# Patient Record
Sex: Female | Born: 1970 | Race: Black or African American | Hispanic: No | Marital: Married | Smoking: Never smoker
Health system: Southern US, Community
[De-identification: ages and names within clinical notes are randomized; demographics above are authoritative.]

## PROBLEM LIST (undated history)

## (undated) DIAGNOSIS — R7303 Prediabetes: Secondary | ICD-10-CM

## (undated) DIAGNOSIS — L500 Allergic urticaria: Secondary | ICD-10-CM

## (undated) HISTORY — DX: Allergic urticaria: L50.0

## (undated) HISTORY — PX: CHOLECYSTECTOMY: SHX55

## (undated) HISTORY — DX: Prediabetes: R73.03

---

## 2005-04-17 ENCOUNTER — Emergency Department: Payer: Self-pay | Admitting: Emergency Medicine

## 2005-09-12 ENCOUNTER — Ambulatory Visit: Payer: Self-pay | Admitting: Obstetrics and Gynecology

## 2005-10-06 ENCOUNTER — Observation Stay: Payer: Self-pay | Admitting: Obstetrics and Gynecology

## 2005-11-21 ENCOUNTER — Inpatient Hospital Stay: Payer: Self-pay | Admitting: Obstetrics and Gynecology

## 2006-12-23 ENCOUNTER — Other Ambulatory Visit: Payer: Self-pay

## 2006-12-23 ENCOUNTER — Emergency Department: Payer: Self-pay

## 2007-09-26 ENCOUNTER — Other Ambulatory Visit: Payer: Self-pay

## 2007-09-26 ENCOUNTER — Emergency Department: Payer: Self-pay | Admitting: Emergency Medicine

## 2007-09-30 ENCOUNTER — Ambulatory Visit: Payer: Self-pay | Admitting: Surgery

## 2007-10-07 ENCOUNTER — Ambulatory Visit: Payer: Self-pay | Admitting: Surgery

## 2010-08-16 ENCOUNTER — Ambulatory Visit: Payer: Self-pay | Admitting: Obstetrics and Gynecology

## 2015-01-18 ENCOUNTER — Other Ambulatory Visit: Payer: Self-pay | Admitting: Obstetrics and Gynecology

## 2015-01-18 DIAGNOSIS — Z1231 Encounter for screening mammogram for malignant neoplasm of breast: Secondary | ICD-10-CM

## 2015-02-07 ENCOUNTER — Ambulatory Visit
Admission: RE | Admit: 2015-02-07 | Discharge: 2015-02-07 | Disposition: A | Discharge status: Home / Self Care | Payer: No Typology Code available for payment source | Source: Ambulatory Visit | Attending: Obstetrics and Gynecology | Admitting: Obstetrics and Gynecology

## 2015-02-07 DIAGNOSIS — Z1231 Encounter for screening mammogram for malignant neoplasm of breast: Secondary | ICD-10-CM | POA: Diagnosis present

## 2015-02-07 DIAGNOSIS — R928 Other abnormal and inconclusive findings on diagnostic imaging of breast: Secondary | ICD-10-CM | POA: Diagnosis not present

## 2015-02-09 ENCOUNTER — Other Ambulatory Visit: Payer: Self-pay | Admitting: Obstetrics and Gynecology

## 2015-02-09 DIAGNOSIS — R928 Other abnormal and inconclusive findings on diagnostic imaging of breast: Secondary | ICD-10-CM

## 2015-02-09 DIAGNOSIS — N6489 Other specified disorders of breast: Secondary | ICD-10-CM

## 2015-02-19 ENCOUNTER — Ambulatory Visit
Admission: RE | Admit: 2015-02-19 | Discharge: 2015-02-19 | Disposition: A | Discharge status: Home / Self Care | Payer: No Typology Code available for payment source | Source: Ambulatory Visit | Attending: Obstetrics and Gynecology | Admitting: Obstetrics and Gynecology

## 2015-02-19 DIAGNOSIS — R928 Other abnormal and inconclusive findings on diagnostic imaging of breast: Secondary | ICD-10-CM | POA: Insufficient documentation

## 2015-02-19 DIAGNOSIS — N63 Unspecified lump in breast: Secondary | ICD-10-CM | POA: Diagnosis not present

## 2015-02-19 DIAGNOSIS — N6489 Other specified disorders of breast: Secondary | ICD-10-CM

## 2016-05-19 DIAGNOSIS — E669 Obesity, unspecified: Secondary | ICD-10-CM | POA: Insufficient documentation

## 2016-05-19 DIAGNOSIS — Z6835 Body mass index (BMI) 35.0-35.9, adult: Secondary | ICD-10-CM | POA: Insufficient documentation

## 2016-05-21 ENCOUNTER — Emergency Department
Admission: EM | Admit: 2016-05-21 | Discharge: 2016-05-21 | Disposition: A | Discharge status: Home / Self Care | Payer: 59 | Attending: Emergency Medicine | Admitting: Emergency Medicine

## 2016-05-21 ENCOUNTER — Emergency Department: Payer: 59

## 2016-05-21 ENCOUNTER — Encounter: Payer: Self-pay | Admitting: Emergency Medicine

## 2016-05-21 DIAGNOSIS — K219 Gastro-esophageal reflux disease without esophagitis: Secondary | ICD-10-CM | POA: Diagnosis not present

## 2016-05-21 DIAGNOSIS — R0789 Other chest pain: Secondary | ICD-10-CM | POA: Diagnosis present

## 2016-05-21 DIAGNOSIS — R079 Chest pain, unspecified: Secondary | ICD-10-CM

## 2016-05-21 LAB — BASIC METABOLIC PANEL
Anion gap: 7 (ref 5–15)
BUN: 9 mg/dL (ref 6–20)
CHLORIDE: 106 mmol/L (ref 101–111)
CO2: 25 mmol/L (ref 22–32)
CREATININE: 0.72 mg/dL (ref 0.44–1.00)
Calcium: 8.7 mg/dL — ABNORMAL LOW (ref 8.9–10.3)
GFR calc Af Amer: >60 mL/min (ref >60)
GFR calc non Af Amer: >60 mL/min (ref >60)
GLUCOSE: 101 mg/dL — AB (ref 65–99)
POTASSIUM: 3.5 mmol/L (ref 3.5–5.1)
Sodium: 138 mmol/L (ref 135–145)

## 2016-05-21 LAB — CBC
HEMATOCRIT: 37.4 % (ref 35.0–47.0)
Hemoglobin: 12.2 g/dL (ref 12.0–16.0)
MCH: 26.3 pg (ref 26.0–34.0)
MCHC: 32.7 g/dL (ref 32.0–36.0)
MCV: 80.5 fL (ref 80.0–100.0)
PLATELETS: 309 10*3/uL (ref 150–440)
RBC: 4.65 MIL/uL (ref 3.80–5.20)
RDW: 14.1 % (ref 11.5–14.5)
WBC: 8 10*3/uL (ref 3.6–11.0)

## 2016-05-21 LAB — TROPONIN I: Troponin I: <0.03 ng/mL (ref <0.03)

## 2016-05-21 MED ORDER — GI COCKTAIL ~~LOC~~
30.0000 mL | Freq: Once | ORAL | Status: AC
  Administered 2016-05-21: 30 mL via ORAL
  Filled 2016-05-21: qty 30

## 2016-05-21 MED ORDER — RANITIDINE HCL 150 MG PO TABS: 150.0000 mg | ORAL_TABLET | Freq: Two times a day (BID) | ORAL | 0 refills | Status: DC

## 2016-05-21 NOTE — ED Provider Notes (Signed)
[facility] Emergency Department Provider Note  ____________________________________________   First MD Initiated Contact with Patient 05/21/16 1953     (approximate)  I have reviewed the triage vital signs and the nursing notes.   HISTORY  Chief Complaint Chest Pain   HPI Rose Mcmillan is a 45 y.o. female with a history of C-section and a family history of GERD who is presenting to the emergency department today with a feeling of pressure in her central chest which she says started after eating pizza and cake yesterday at lunch. Says the pressure is worsened when she swallows she has been able to eat rice today. She denies any nausea or vomiting. Said that she took Pepto-Bismol last night which temporarily relieved the pain.Denies any associated symptoms of shortness of breath, nausea vomiting or diarrhea. Has a family history of reflux. Her mother's in the room and says that she has similar symptoms with her reflux. The patient says that she has also had a mild amount of radiation of the pain through to her back. Denies any recent injury. However, she says that she was opening boxes Monday night and feels that she may have pulled a muscle in her chest. However, she says that there is no pain when she moves her upper extremities. Denies any hormone supplementation or birth control. Says that the pain is mildly increased with deep breathing.   History reviewed. No pertinent past medical history.  There are no active problems to display for this patient.   Past Surgical History:  Procedure Laterality Date  . CESAREAN SECTION     x2  . CHOLECYSTECTOMY      Prior to Admission medications   Not on File    Allergies Review of patient's allergies indicates no known allergies.  No family history on file.  Social History Social History  Substance Use Topics  . Smoking status: Never Smoker  . Smokeless tobacco: Never Used  . Alcohol use  No    Review of Systems Constitutional: No fever/chills Eyes: No visual changes. ENT: No sore throat. Cardiovascular: As above Respiratory: Denies shortness of breath. Gastrointestinal: No abdominal pain.  No nausea, no vomiting.  No diarrhea.  No constipation. Genitourinary: Negative for dysuria. Musculoskeletal: As above Skin: Negative for rash. Neurological: Negative for headaches, focal weakness or numbness.  10-point ROS otherwise negative.  ____________________________________________   PHYSICAL EXAM:  VITAL SIGNS: ED Triage Vitals  Enc Vitals Group     BP 05/21/16 1844 121/76     Pulse Rate 05/21/16 1844 97     Resp 05/21/16 1844 18     Temp 05/21/16 1844 98.6 F (37 C)     Temp Source 05/21/16 1844 Oral     SpO2 05/21/16 1844 100 %     Weight 05/21/16 1844 189 lb (85.7 kg)     Height 05/21/16 1844 5\' 4"  (1.626 m)     Head Circumference --      Peak Flow --      Pain Score 05/21/16 1846 0     Pain Loc --      Pain Edu? --      Excl. in GC? --     Constitutional: Alert and oriented. Well appearing and in no acute distress. Eyes: Conjunctivae are normal. PERRL. EOMI. Head: Atraumatic. Nose: No congestion/rhinnorhea. Mouth/Throat: Mucous membranes are moist.  Neck: No stridor.   Cardiovascular: Normal rate, regular rhythm. Grossly normal heart sounds.  Good peripheral circulation With equal and palpable bilateral  dorsalis pedis as well as radial pulses. Chest pain is not reproduced for palpation. Respiratory: Normal respiratory effort.  No retractions. Lungs CTAB. Gastrointestinal: Soft and nontender. No distention.  Musculoskeletal: No lower extremity tenderness nor edema.  No joint effusions. Neurologic:  Normal speech and language. No gross focal neurologic deficits are appreciated.  Skin:  Skin is warm, dry and intact. No rash noted. Psychiatric: Mood and affect are normal. Speech and behavior are normal.  ____________________________________________     LABS (all labs ordered are listed, but only abnormal results are displayed)  Labs Reviewed  BASIC METABOLIC PANEL - Abnormal; Notable for the following:       Result Value   Glucose, Bld 101 (*)    Calcium 8.7 (*)    All other components within normal limits  CBC  TROPONIN I   ____________________________________________  EKG  ED ECG REPORT I, Arelia Longest, the attending physician, personally viewed and interpreted this ECG.   Date: 05/21/2016  EKG Time: 1843  Rate: 89  Rhythm: normal sinus rhythm  Axis: Normal  Intervals:none  ST&T Change: No ST segment elevation or depression. No abnormal T-wave inversion.  ____________________________________________  RADIOLOGY  DG Chest 2 View (Accession [id number]) (Order [id number])  Imaging  Date: 05/21/2016 Department: [facility] EMERGENCY DEPARTMENT Released By: Lorriane Shire, RN (auto-released) Authorizing: Myrna Blazer, MD  PACS Images   Show images for DG Chest 2 View  Study Result   CLINICAL DATA:  Chest pain. Central chest tightness since yesterday.  EXAM: CHEST  2 VIEW  COMPARISON:  12/23/2006  FINDINGS: The cardiomediastinal contours are normal. The lungs are clear. Pulmonary vasculature is normal. No consolidation, pleural effusion, or pneumothorax. No acute osseous abnormalities are seen.  IMPRESSION: No acute pulmonary process.   Electronically Signed   By: Rubye Oaks M.D.   On: 05/21/2016 19:09    ____________________________________________   PROCEDURES  Procedure(s) performed:   Procedures  Critical Care performed:   ____________________________________________   INITIAL IMPRESSION / ASSESSMENT AND PLAN / ED COURSE  Pertinent labs & imaging results that were available during my care of the patient were reviewed by me and considered in my medical decision making (see chart for  details).  ----------------------------------------- 9:13 PM on 05/21/2016 -----------------------------------------  Patient says that after GI cocktail that her pain went down to a 7 out of 10. Given a warm coke and she says now that her pain is at a 1-2 out of 10. Unclear if there was a small amount of impaction in the esophagus versus irritation from reflux. We'll be starting the patient on Zantac. She is borderline asymptomatic at this time. Very reassuring workup in the patient has been having symptoms for over 24 hours. Will be discharged home. Reviewed the plan with the patient as well as her family and they're understanding and wanted to comply.  Clinical Course     ____________________________________________   FINAL CLINICAL IMPRESSION(S) / ED DIAGNOSES  Chest pain. Reflux.    NEW MEDICATIONS STARTED DURING THIS VISIT:  New Prescriptions   No medications on file     Note:  This document was prepared using Dragon voice recognition software and may include unintentional dictation errors.    Myrna Blazer, MD 05/21/16 2114

## 2016-05-21 NOTE — ED Notes (Signed)
Discharge instructions reviewed with patient. Patient verbalized understanding. Patient ambulated to lobby without difficulty.   

## 2016-05-21 NOTE — ED Triage Notes (Signed)
Pt reports chest tightness started yesterday, reports when she's swallowing, she feels like something is getting stuck mid chest. Pt reports it started after eating pizza yesterday. Pt denies shortness of breath, nausea or vomiting.

## 2016-05-21 NOTE — ED Notes (Signed)
Pt states chest tightness and difficulty swallowing since eating pizza and cake yesterday for lunch. Pt states she took tums and pepto. States pain less today.

## 2016-05-23 ENCOUNTER — Other Ambulatory Visit: Payer: Self-pay | Admitting: Adult Health

## 2016-05-23 DIAGNOSIS — N6310 Unspecified lump in the right breast, unspecified quadrant: Secondary | ICD-10-CM

## 2016-06-09 ENCOUNTER — Ambulatory Visit
Admission: RE | Admit: 2016-06-09 | Discharge: 2016-06-09 | Disposition: A | Discharge status: Home / Self Care | Payer: 59 | Source: Ambulatory Visit | Attending: Adult Health | Admitting: Adult Health

## 2016-06-09 DIAGNOSIS — N6310 Unspecified lump in the right breast, unspecified quadrant: Secondary | ICD-10-CM | POA: Diagnosis present

## 2016-09-05 IMAGING — MG MM DIGITAL SCREENING BILATERAL
4 series · 4 of 4 positions shown · non-contrast
Comparison: Previous exam(s).

CLINICAL DATA: Screening.

EXAM:
DIGITAL SCREENING BILATERAL MAMMOGRAM WITH CAD

[R CC]
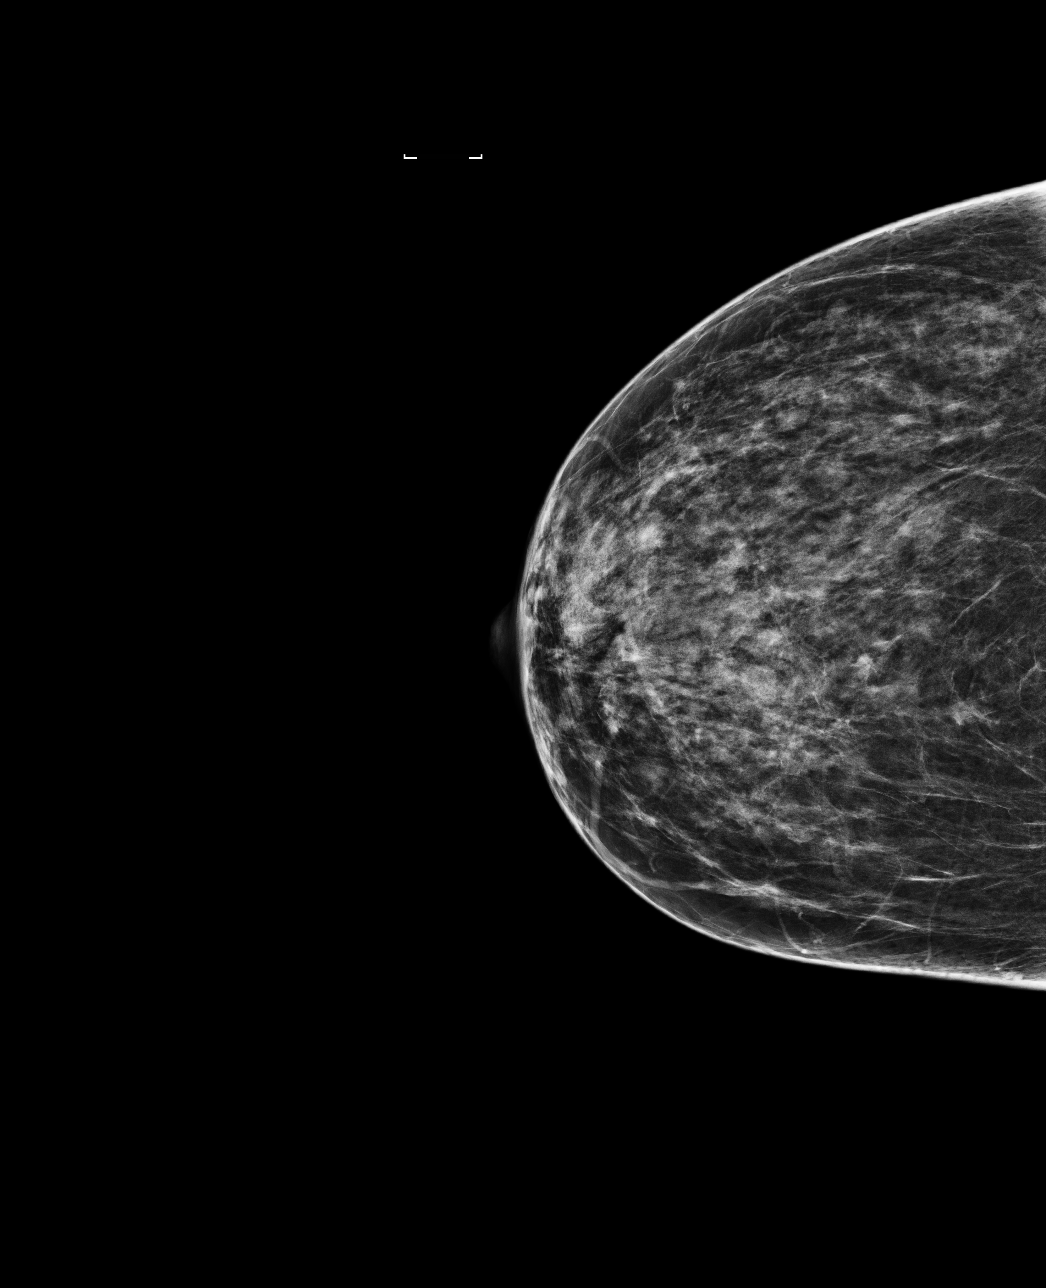

[L CC]
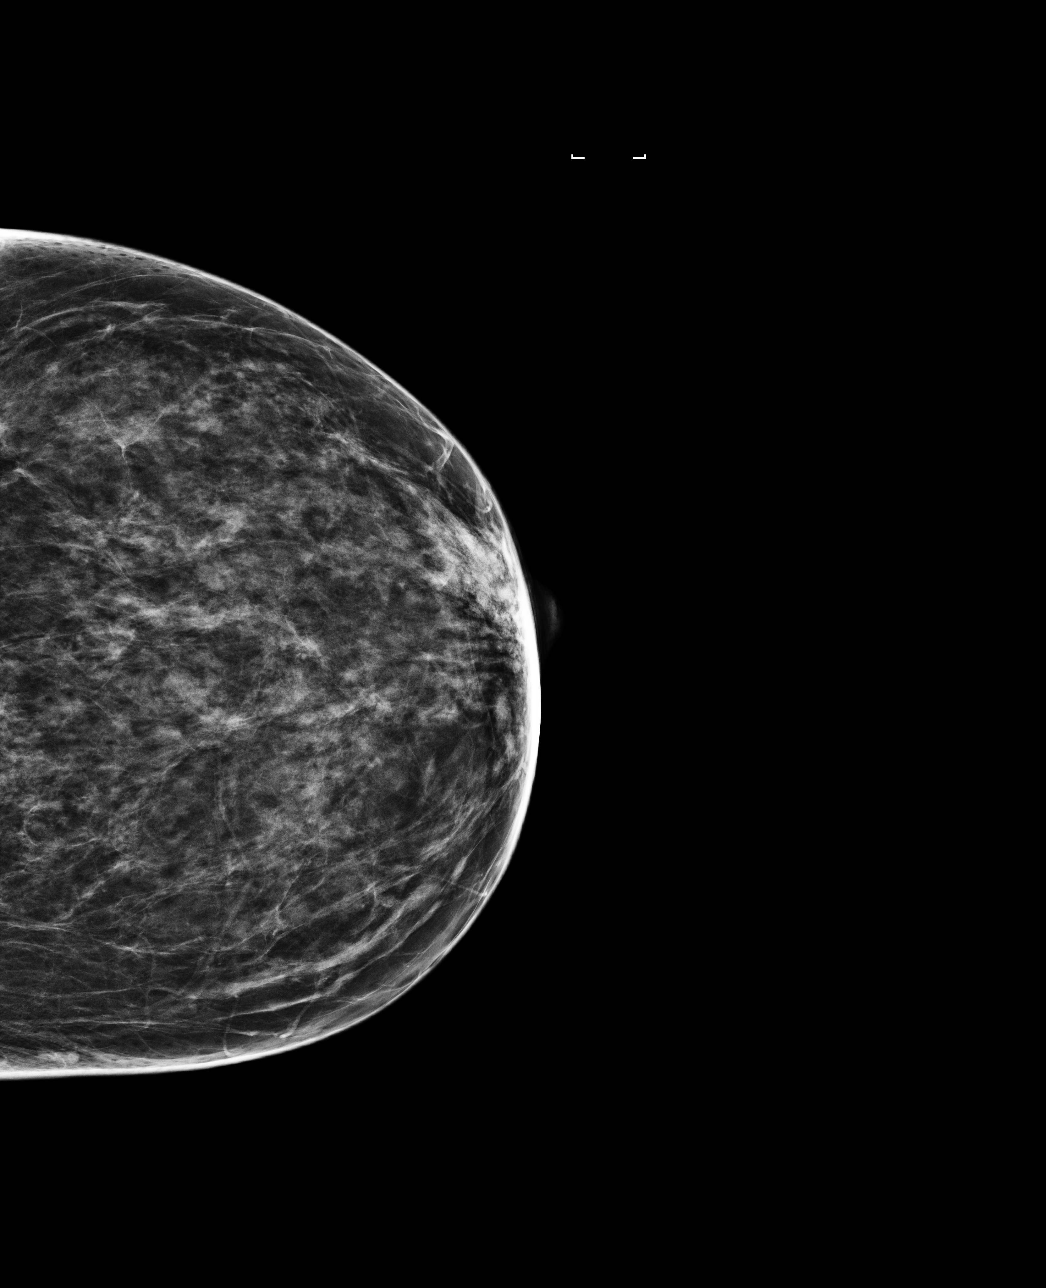

[L MLO]
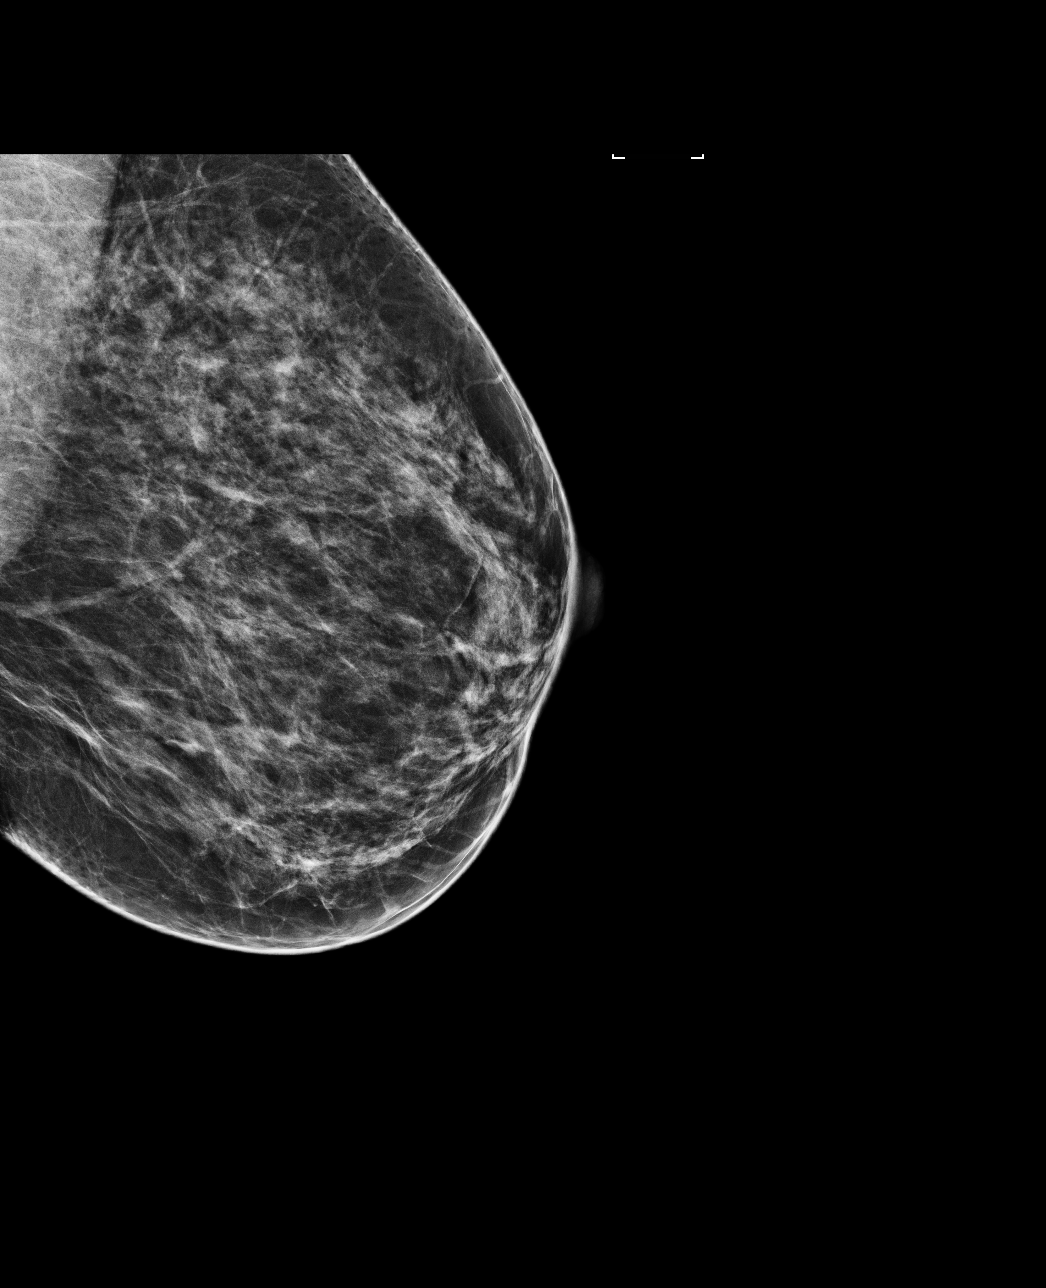

[R MLO]
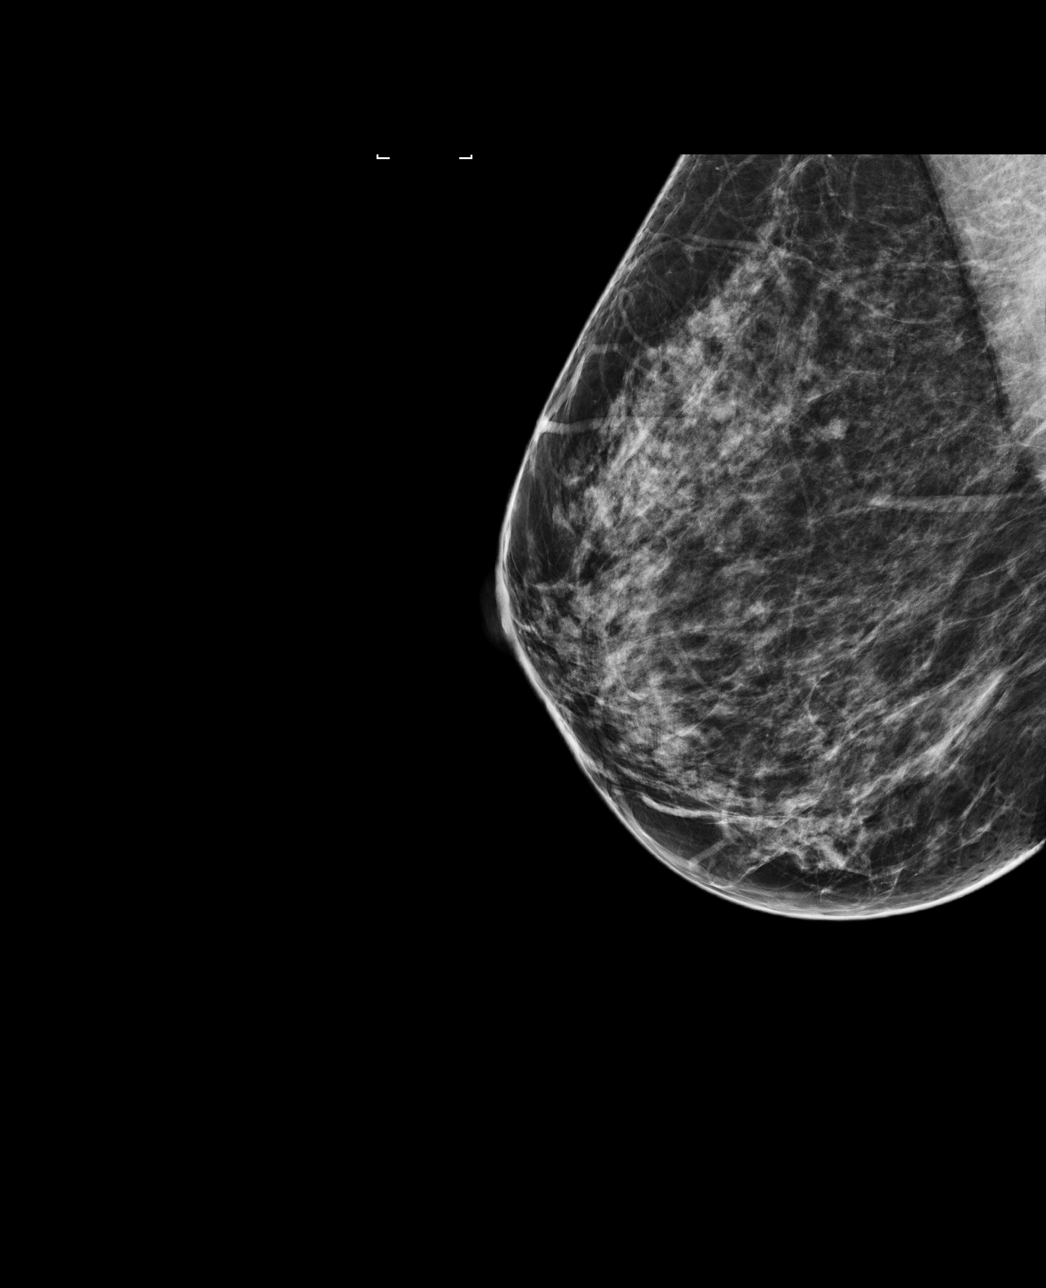

[4 of 4 positions shown; findings below may reference images not displayed]

ACR Breast Density Category c: The breast tissue is heterogeneously
dense, which may obscure small masses.
FINDINGS: In the right breast, a possible asymmetry warrants further
evaluation. In the left breast, no findings suspicious for
malignancy. Images were processed with CAD.
IMPRESSION: Further evaluation is suggested for possible asymmetry in the right
breast.

RECOMMENDATION:
Diagnostic mammogram and possibly ultrasound of the right breast.
(Code:S9-I-77H)

The patient will be contacted regarding the findings, and additional
imaging will be scheduled.

BI-RADS CATEGORY  0: Incomplete. Need additional imaging evaluation
and/or prior mammograms for comparison.

## 2016-12-21 ENCOUNTER — Emergency Department: Payer: 59

## 2016-12-21 ENCOUNTER — Emergency Department
Admission: EM | Admit: 2016-12-21 | Discharge: 2016-12-21 | Disposition: A | Discharge status: Home / Self Care | Payer: 59 | Attending: Emergency Medicine | Admitting: Emergency Medicine

## 2016-12-21 ENCOUNTER — Encounter: Payer: Self-pay | Admitting: Emergency Medicine

## 2016-12-21 DIAGNOSIS — M546 Pain in thoracic spine: Secondary | ICD-10-CM | POA: Insufficient documentation

## 2016-12-21 DIAGNOSIS — R0602 Shortness of breath: Secondary | ICD-10-CM | POA: Insufficient documentation

## 2016-12-21 DIAGNOSIS — Z79899 Other long term (current) drug therapy: Secondary | ICD-10-CM | POA: Insufficient documentation

## 2016-12-21 DIAGNOSIS — R071 Chest pain on breathing: Secondary | ICD-10-CM | POA: Insufficient documentation

## 2016-12-21 DIAGNOSIS — R079 Chest pain, unspecified: Secondary | ICD-10-CM

## 2016-12-21 LAB — CBC
HCT: 39 % (ref 35.0–47.0)
Hemoglobin: 12.9 g/dL (ref 12.0–16.0)
MCH: 26.3 pg (ref 26.0–34.0)
MCHC: 33 g/dL (ref 32.0–36.0)
MCV: 79.8 fL — AB (ref 80.0–100.0)
PLATELETS: 366 10*3/uL (ref 150–440)
RBC: 4.89 MIL/uL (ref 3.80–5.20)
RDW: 14 % (ref 11.5–14.5)
WBC: 7.8 10*3/uL (ref 3.6–11.0)

## 2016-12-21 LAB — BASIC METABOLIC PANEL
Anion gap: 8 (ref 5–15)
BUN: 8 mg/dL (ref 6–20)
CHLORIDE: 102 mmol/L (ref 101–111)
CO2: 28 mmol/L (ref 22–32)
CREATININE: 0.9 mg/dL (ref 0.44–1.00)
Calcium: 8.9 mg/dL (ref 8.9–10.3)
GFR calc Af Amer: >60 mL/min (ref >60)
GFR calc non Af Amer: >60 mL/min (ref >60)
GLUCOSE: 144 mg/dL — AB (ref 65–99)
Potassium: 3.3 mmol/L — ABNORMAL LOW (ref 3.5–5.1)
Sodium: 138 mmol/L (ref 135–145)

## 2016-12-21 LAB — TROPONIN I

## 2016-12-21 MED ORDER — MORPHINE SULFATE (PF) 4 MG/ML IV SOLN
4.0000 mg | Freq: Once | INTRAVENOUS | Status: AC
Start: 2016-12-21 — End: 2016-12-21
  Administered 2016-12-21: 4 mg via INTRAVENOUS
  Filled 2016-12-21: qty 1

## 2016-12-21 MED ORDER — ONDANSETRON HCL 4 MG/2ML IJ SOLN
4.0000 mg | Freq: Once | INTRAMUSCULAR | Status: AC
  Administered 2016-12-21: 4 mg via INTRAVENOUS
  Filled 2016-12-21: qty 2

## 2016-12-21 MED ORDER — SODIUM CHLORIDE 0.9 % IV BOLUS (SEPSIS)
1000.0000 mL | Freq: Once | INTRAVENOUS | Status: AC
  Administered 2016-12-21: 1000 mL via INTRAVENOUS

## 2016-12-21 MED ORDER — TRAMADOL HCL 50 MG PO TABS: 50.0000 mg | ORAL_TABLET | Freq: Four times a day (QID) | ORAL | 0 refills | Status: DC | PRN

## 2016-12-21 MED ORDER — IOPAMIDOL (ISOVUE-370) INJECTION 76%
75.0000 mL | Freq: Once | INTRAVENOUS | Status: AC | PRN
  Administered 2016-12-21: 75 mL via INTRAVENOUS

## 2016-12-21 MED ORDER — TRAMADOL HCL 50 MG PO TABS
50.0000 mg | ORAL_TABLET | Freq: Once | ORAL | Status: AC
  Administered 2016-12-21: 50 mg via ORAL

## 2016-12-21 MED ORDER — TRAMADOL HCL 50 MG PO TABS
ORAL_TABLET | ORAL | Status: AC
  Administered 2016-12-21: 50 mg via ORAL
  Filled 2016-12-21: qty 1

## 2016-12-21 NOTE — ED Provider Notes (Signed)
[facility] Emergency Department Provider Note  Time seen: 9:37 PM  I have reviewed the triage vital signs and the nursing notes.   HISTORY  Chief Complaint Chest Pain    HPI Rose Mcmillan is a 46 y.o. female with no past medical history who presents the emergency department with left-sided chest pain. According to the patient beginning yesterday she has been experiencing left posterior chest pain worse with movement or deep inspiration. She also states mild shortness of breath since this time as well. Denies any inciting event. Does not recall pulling any muscles, etc. Patient denies any history of blood clots in the past. Denies any leg pain or swelling. Denies any long plane or car rides. Denies estrogen use. Denies fever cough or congestion. She does state the pain is much worse with movement.  History reviewed. No pertinent past medical history.  There are no active problems to display for this patient.   Past Surgical History:  Procedure Laterality Date  . CESAREAN SECTION     x2  . CHOLECYSTECTOMY      Prior to Admission medications   Medication Sig Start Date End Date Taking? Authorizing Provider  ranitidine (ZANTAC) 150 MG tablet Take 1 tablet (150 mg total) by mouth 2 (two) times daily. 05/21/16 05/21/17  Myrna Blazer, MD    No Known Allergies  History reviewed. No pertinent family history.  Social History Social History  Substance Use Topics  . Smoking status: Never Smoker  . Smokeless tobacco: Never Used  . Alcohol use No    Review of Systems Constitutional: Negative for fever. Cardiovascular: Posterior left chest pain Respiratory: Positive for shortness of breath Gastrointestinal: Negative for abdominal pain Musculoskeletal: Left upper back pain Neurological: Negative for headache 10-point ROS otherwise negative.  ____________________________________________   PHYSICAL EXAM:  VITAL SIGNS: ED Triage Vitals   Enc Vitals Group     BP 12/21/16 2051 140/80     Pulse Rate 12/21/16 2051 (!) 115     Resp 12/21/16 2051 20     Temp 12/21/16 2051 98.9 F (37.2 C)     Temp Source 12/21/16 2051 Oral     SpO2 12/21/16 2051 98 %     Weight 12/21/16 2052 175 lb (79.4 kg)     Height 12/21/16 2052  (1.626 m)     Head Circumference --      Peak Flow --      Pain Score 12/21/16 2049 10     Pain Loc --      Pain Edu? --      Excl. in GC? --     Constitutional: Alert and oriented. Well appearing and in no distress. Eyes: Normal exam ENT   Head: Normocephalic and atraumatic.   Mouth/Throat: Mucous membranes are moist. Cardiovascular: Normal rate, regular rhythm. No murmur Respiratory: Mild tachypnea. Clear breath sounds bilaterally. No wheezes rales or rhonchi. Chest and back are nontender to palpation. Pain is reproduced with movement such as twisting or sitting up. Gastrointestinal: Soft and nontender. No distention. Musculoskeletal: Nontender with normal range of motion in all extremities. No lower extremity tenderness or edema. Neurologic:  Normal speech and language. No gross focal neurologic deficits are appreciated. Skin:  Skin is warm, dry and intact.  Psychiatric: Mood and affect are normal. Speech and behavior are normal.   ____________________________________________    EKG  EKG reviewed and interpreted by myself shows sinus tachycardia 108 bpm, narrow QRS, normal axis, normal intervals, nonspecific ST changes  without ST elevation.  ____________________________________________    RADIOLOGY  CT scan of the chest is negative  ____________________________________________   INITIAL IMPRESSION / ASSESSMENT AND PLAN / ED COURSE  Pertinent labs & imaging results that were available during my care of the patient were reviewed by me and considered in my medical decision making (see chart for details).  Patient presents the emergency department for left posterior chest pain  since yesterday worse with movement but also with any deep breathing. Patient is mildly tachycardic from 100-110bpm.  Given the patient's shortness of breath with pleuritic chest pain and tachycardia we'll obtain a CT angiography of the chest to rule out PE. Patient is labs including troponin are negative. We will treat with morphine, Zofran, IV fluids and continue to closely monitor while awaiting CT results.  CT angiography of the chest is negative. Labs are normal including negative troponin. EKG is reassuring. Highly suspect mostly skeletal type pain given the significant pain with movement and positional pain. We'll discharge with Ultram. Patient will follow-up with her primary care doctor.  ____________________________________________   FINAL CLINICAL IMPRESSION(S) / ED DIAGNOSES  Chest pain    Minna Antis, MD 12/21/16 2313

## 2016-12-21 NOTE — ED Notes (Signed)
Pt discharged to home.  Family member driving.  Discharge instructions reviewed.  Verbalized understanding.  No questions or concerns at this time.  Teach back verified.  Pt in NAD.  No items left in ED.   

## 2016-12-21 NOTE — Discharge Instructions (Signed)
You have been seen in the emergency department today for chest pain. Your workup has shown normal results. As we discussed please follow-up with your primary care physician in the next several days for recheck. Return to the emergency department for any further chest pain, trouble breathing, or any other symptom personally concerning to yourself.

## 2016-12-21 NOTE — ED Triage Notes (Signed)
Pt reports chest pain that started last night with back pain 10/10 tightness. Denies N/V/lightheadedness or sweats.  Reports radiating to back and SOB.

## 2016-12-21 NOTE — ED Notes (Signed)
Pt back from CT; visiting with family at bedside; rates chest pain 10/10

## 2018-06-24 ENCOUNTER — Ambulatory Visit: Payer: Self-pay | Admitting: Obstetrics and Gynecology

## 2018-06-30 ENCOUNTER — Encounter: Payer: Self-pay | Admitting: Obstetrics and Gynecology

## 2018-06-30 ENCOUNTER — Ambulatory Visit (INDEPENDENT_AMBULATORY_CARE_PROVIDER_SITE_OTHER): Payer: 59 | Admitting: Obstetrics and Gynecology

## 2018-06-30 ENCOUNTER — Other Ambulatory Visit (HOSPITAL_COMMUNITY)
Admission: RE | Admit: 2018-06-30 | Discharge: 2018-06-30 | Disposition: A | Discharge status: Home / Self Care | Payer: 59 | Source: Ambulatory Visit | Attending: Obstetrics and Gynecology | Admitting: Obstetrics and Gynecology

## 2018-06-30 VITALS — BP 130/76 | HR 85 | Ht 64.0 in | Wt 193.0 lb

## 2018-06-30 DIAGNOSIS — Z124 Encounter for screening for malignant neoplasm of cervix: Secondary | ICD-10-CM

## 2018-06-30 DIAGNOSIS — Z0000 Encounter for general adult medical examination without abnormal findings: Secondary | ICD-10-CM

## 2018-06-30 DIAGNOSIS — Z131 Encounter for screening for diabetes mellitus: Secondary | ICD-10-CM

## 2018-06-30 DIAGNOSIS — Z01411 Encounter for gynecological examination (general) (routine) with abnormal findings: Secondary | ICD-10-CM | POA: Diagnosis not present

## 2018-06-30 DIAGNOSIS — Z1329 Encounter for screening for other suspected endocrine disorder: Secondary | ICD-10-CM

## 2018-06-30 DIAGNOSIS — Z01419 Encounter for gynecological examination (general) (routine) without abnormal findings: Secondary | ICD-10-CM

## 2018-06-30 DIAGNOSIS — Z13220 Encounter for screening for lipoid disorders: Secondary | ICD-10-CM

## 2018-06-30 DIAGNOSIS — Z1151 Encounter for screening for human papillomavirus (HPV): Secondary | ICD-10-CM

## 2018-06-30 DIAGNOSIS — Z1239 Encounter for other screening for malignant neoplasm of breast: Secondary | ICD-10-CM

## 2018-06-30 DIAGNOSIS — N946 Dysmenorrhea, unspecified: Secondary | ICD-10-CM

## 2018-06-30 DIAGNOSIS — R635 Abnormal weight gain: Secondary | ICD-10-CM

## 2018-06-30 NOTE — Progress Notes (Signed)
PCP:  Raynelle Bring   Chief Complaint  Patient presents with  . Gynecologic Exam    concerned abt weight gain, lower back pain first 2 days of each cycle x 1 yr     HPI:      [name] is a 47 y.o. [id number] who LMP was Patient's last menstrual period was 06/18/2018 (exact date)., presents today for her NP annual examination.  Her menses are regular every 28-30 days, lasting 5-7 days.  Dysmenorrhea mild in low back for the past yr or so, occurring first 1-2 days of flow. She does not have intermenstrual bleeding. No vasomotor sx.  Sex activity: single partner, contraception - tubal ligation.  Last Pap: not recent; no hx of abn Hx of STDs: none  Last mammogram: not recent There is no FH of breast cancer. There is no FH of ovarian cancer. The patient does do self-breast exams.  Tobacco use: The patient denies current or previous tobacco use. Alcohol use: none No drug use.  Exercise: not active  She does get adequate calcium but not Vitamin D in her diet. No recent labs. Complains of about 15-20# wt gain in past 1 1/2 yrs. No diet/exercise changes.   History reviewed. No pertinent past medical history.  Past Surgical History:  Procedure Laterality Date  . CESAREAN SECTION     x2  . CHOLECYSTECTOMY      Family History  Problem Relation Age of Onset  . Colon cancer Maternal Grandmother 84  . Breast cancer Neg Hx   . Ovarian cancer Neg Hx     Social History   Socioeconomic History  . Marital status: Married    Spouse name: Not on file  . Number of children: Not on file  . Years of education: Not on file  . Highest education level: Not on file  Occupational History  . Not on file  Social Needs  . Financial resource strain: Not on file  . Food insecurity:    Worry: Not on file    Inability: Not on file  . Transportation needs:    Medical: Not on file    Non-medical: Not on file  Tobacco Use  . Smoking status: Never Smoker  . Smokeless  tobacco: Never Used  Substance and Sexual Activity  . Alcohol use: No  . Drug use: No  . Sexual activity: Yes    Birth control/protection: Surgical    Comment: tubal ligation  Lifestyle  . Physical activity:    Days per week: Not on file    Minutes per session: Not on file  . Stress: Not on file  Relationships  . Social connections:    Talks on phone: Not on file    Gets together: Not on file    Attends religious service: Not on file    Active member of club or organization: Not on file    Attends meetings of clubs or organizations: Not on file    Relationship status: Not on file  . Intimate partner violence:    Fear of current or ex partner: Not on file    Emotionally abused: Not on file    Physically abused: Not on file    Forced sexual activity: Not on file  Other Topics Concern  . Not on file  Social History Narrative  . Not on file    Outpatient Medications Prior to Visit  Medication Sig Dispense Refill  . ranitidine (ZANTAC) 150 MG tablet Take 1 tablet (150 mg  total) by mouth 2 (two) times daily. 60 tablet 0  . traMADol (ULTRAM) 50 MG tablet Take 1 tablet (50 mg total) by mouth every 6 (six) hours as needed. 10 tablet 0   No facility-administered medications prior to visit.     ROS:  Review of Systems  Constitutional: Negative for fatigue, fever and unexpected weight change.  Respiratory: Negative for cough, shortness of breath and wheezing.   Cardiovascular: Negative for chest pain, palpitations and leg swelling.  Gastrointestinal: Negative for blood in stool, constipation, diarrhea, nausea and vomiting.  Endocrine: Negative for cold intolerance, heat intolerance and polyuria.  Genitourinary: Negative for dyspareunia, dysuria, flank pain, frequency, genital sores, hematuria, menstrual problem, pelvic pain, urgency, vaginal bleeding, vaginal discharge and vaginal pain.  Musculoskeletal: Positive for back pain. Negative for joint swelling and myalgias.  Skin:  Negative for rash.  Neurological: Negative for dizziness, syncope, light-headedness, numbness and headaches.  Hematological: Negative for adenopathy.  Psychiatric/Behavioral: Negative for agitation, confusion, sleep disturbance and suicidal ideas. The patient is not nervous/anxious.    BREAST: No symptoms   Objective: BP 130/76   Pulse 85   Ht 5\' 4"  (1.626 m)   Wt 193 lb (87.5 kg)   LMP 06/18/2018 (Exact Date)   BMI 33.13 kg/m    Physical Exam  Constitutional: She is oriented to person, place, and time. She appears well-developed and well-nourished.  Genitourinary: Vagina normal and uterus normal. There is no rash or tenderness on the right labia. There is no rash or tenderness on the left labia. No erythema or tenderness in the vagina. No vaginal discharge found. Right adnexum does not display mass and does not display tenderness. Left adnexum does not display mass and does not display tenderness. Cervix does not exhibit motion tenderness or polyp. Uterus is not enlarged or tender.  Neck: Normal range of motion. No thyromegaly present.  Cardiovascular: Normal rate, regular rhythm and normal heart sounds.  No murmur heard. Pulmonary/Chest: Effort normal and breath sounds normal. Right breast exhibits no mass, no nipple discharge, no skin change and no tenderness. Left breast exhibits no mass, no nipple discharge, no skin change and no tenderness.  Abdominal: Soft. There is no tenderness. There is no guarding.  Musculoskeletal: Normal range of motion.  Neurological: She is alert and oriented to person, place, and time. No cranial nerve deficit.  Psychiatric: She has a normal mood and affect. Her behavior is normal.  Vitals reviewed.   Assessment/Plan: Encounter for annual routine gynecological examination  Cervical cancer screening - Plan: Cytology - PAP  Screening for HPV (human papillomavirus) - Plan: Cytology - PAP  Screening for breast cancer - Pt to sched mammo - Plan: MM  3D SCREEN BREAST BILATERAL  Blood tests for routine general physical examination - Plan: Comprehensive metabolic panel, Lipid panel, TSH + free T4, Hemoglobin A1c  Screening cholesterol level - Plan: Lipid panel  Screening for diabetes mellitus - Plan: Hemoglobin A1c  Thyroid disorder screening - Plan: TSH + free T4  Weight gain - Diet/exercise/wt loss changes discussed. CHeck labs. Will f/u wiht results.  - Plan: TSH + free T4, Hemoglobin A1c  Dysmenorrhea - Noticed in low back. Stretch/exercises/heating pad/NSAIDs. Will probably improve with increased activity.           GYN counsel breast self exam, mammography screening, adequate intake of calcium and vitamin D, diet and exercise     F/U  Return in about 1 year (around 07/01/2019).  Alicia B. Copland, PA-C 06/30/2018 3:04 PM

## 2018-06-30 NOTE — Patient Instructions (Signed)
I value your feedback and entrusting us with your care. If you get a [facility] patient survey, I would appreciate you taking the time to let us know about your experience today. Thank you! 

## 2018-07-05 LAB — CYTOLOGY - PAP
Diagnosis: NEGATIVE
HPV (WINDOPATH): NOT DETECTED

## 2018-07-15 ENCOUNTER — Telehealth: Payer: Self-pay

## 2018-07-15 NOTE — Telephone Encounter (Signed)
It's already in there. She can call to sched.

## 2018-07-15 NOTE — Telephone Encounter (Signed)
Called pt, no answer, LVMTRC. 

## 2018-07-15 NOTE — Telephone Encounter (Signed)
Pt needs orders put in so she can get her mammo

## 2018-07-23 NOTE — Telephone Encounter (Signed)
Pt aware order is in and she can call to schedule mammogram.

## 2018-07-26 ENCOUNTER — Other Ambulatory Visit: Payer: Self-pay | Admitting: Obstetrics and Gynecology

## 2018-07-26 DIAGNOSIS — N6310 Unspecified lump in the right breast, unspecified quadrant: Secondary | ICD-10-CM

## 2018-07-28 ENCOUNTER — Telehealth: Payer: Self-pay

## 2018-07-28 NOTE — Telephone Encounter (Signed)
Pt calling stating that she needs a referral for mammogram. She was supposed to have a dx mammo and did not go to it. So they are requiring a referral for this one. [facility]

## 2018-07-28 NOTE — Telephone Encounter (Signed)
[facility] Monday, [facility]/16/18 @ 3:00pm. Patient can contact [facility] directly at [phone] if she needs to reschedule. Lmtrc.

## 2018-07-28 NOTE — Telephone Encounter (Signed)
Orders already placed. Do you need to sched? Pls f/u with pt. Thx

## 2018-08-03 ENCOUNTER — Telehealth: Payer: Self-pay

## 2018-08-03 NOTE — Telephone Encounter (Signed)
Pt hasn't had blood work drawn.  Does she need new referral?  Hopes to get done tomorrow or Friday if lab open.  [phone]  Pt aware orders are still good.  Can have drawn here in the office tomorrow; [facility] closed on Fri.  Needs to be on sched.  Pt sched for 9am.

## 2018-08-04 ENCOUNTER — Other Ambulatory Visit: Payer: 59

## 2018-08-04 DIAGNOSIS — Z13220 Encounter for screening for lipoid disorders: Secondary | ICD-10-CM

## 2018-08-04 DIAGNOSIS — Z0000 Encounter for general adult medical examination without abnormal findings: Secondary | ICD-10-CM

## 2018-08-04 DIAGNOSIS — Z131 Encounter for screening for diabetes mellitus: Secondary | ICD-10-CM

## 2018-08-04 DIAGNOSIS — Z1329 Encounter for screening for other suspected endocrine disorder: Secondary | ICD-10-CM

## 2018-08-04 DIAGNOSIS — R635 Abnormal weight gain: Secondary | ICD-10-CM

## 2018-08-05 LAB — LIPID PANEL
CHOL/HDL RATIO: 2.1 ratio (ref 0.0–4.4)
Cholesterol, Total: 143 mg/dL (ref 100–199)
HDL: 67 mg/dL (ref >39)
LDL Calculated: 60 mg/dL (ref 0–99)
Triglycerides: 80 mg/dL (ref 0–149)
VLDL CHOLESTEROL CAL: 16 mg/dL (ref 5–40)

## 2018-08-05 LAB — COMPREHENSIVE METABOLIC PANEL
A/G RATIO: 1.4 (ref 1.2–2.2)
ALBUMIN: 4.1 g/dL (ref 3.5–5.5)
ALT: 12 IU/L (ref 0–32)
AST: 13 IU/L (ref 0–40)
Alkaline Phosphatase: 85 IU/L (ref 39–117)
BILIRUBIN TOTAL: 0.6 mg/dL (ref 0.0–1.2)
BUN / CREAT RATIO: 9 (ref 9–23)
BUN: 8 mg/dL (ref 6–24)
CALCIUM: 8.6 mg/dL — AB (ref 8.7–10.2)
CHLORIDE: 102 mmol/L (ref 96–106)
CO2: 23 mmol/L (ref 20–29)
Creatinine, Ser: 0.88 mg/dL (ref 0.57–1.00)
GFR calc Af Amer: 90 mL/min/{1.73_m2} (ref >59)
GFR, EST NON AFRICAN AMERICAN: 78 mL/min/{1.73_m2} (ref >59)
Globulin, Total: 2.9 g/dL (ref 1.5–4.5)
Glucose: 104 mg/dL — ABNORMAL HIGH (ref 65–99)
POTASSIUM: 4.1 mmol/L (ref 3.5–5.2)
Sodium: 142 mmol/L (ref 134–144)
Total Protein: 7 g/dL (ref 6.0–8.5)

## 2018-08-05 LAB — TSH+FREE T4
FREE T4: 1.2 ng/dL (ref 0.82–1.77)
TSH: 1.17 u[IU]/mL (ref 0.450–4.500)

## 2018-08-05 LAB — HEMOGLOBIN A1C
Est. average glucose Bld gHb Est-mCnc: 131 mg/dL
Hgb A1c MFr Bld: 6.2 % — ABNORMAL HIGH (ref 4.8–5.6)

## 2018-08-16 ENCOUNTER — Ambulatory Visit
Admission: RE | Admit: 2018-08-16 | Discharge: 2018-08-16 | Disposition: A | Discharge status: Home / Self Care | Payer: BLUE CROSS/BLUE SHIELD | Source: Ambulatory Visit | Attending: Obstetrics and Gynecology | Admitting: Obstetrics and Gynecology

## 2018-08-16 ENCOUNTER — Encounter: Payer: Self-pay | Admitting: Obstetrics and Gynecology

## 2018-08-16 DIAGNOSIS — N6310 Unspecified lump in the right breast, unspecified quadrant: Secondary | ICD-10-CM | POA: Insufficient documentation

## 2018-08-16 DIAGNOSIS — R922 Inconclusive mammogram: Secondary | ICD-10-CM | POA: Diagnosis not present

## 2018-09-23 ENCOUNTER — Telehealth: Payer: Self-pay

## 2018-09-23 DIAGNOSIS — R7303 Prediabetes: Secondary | ICD-10-CM | POA: Insufficient documentation

## 2018-09-23 NOTE — Telephone Encounter (Signed)
Pt aware of labs from 11/19. Diet/exercise/wt loss changes for pre-DM. REchk labs in 6 months. Will call with results. Discussed exercise and My[employer] app.

## 2018-09-23 NOTE — Telephone Encounter (Signed)
Pt calling for blood test results from Nov.  [phone]

## 2018-09-23 NOTE — Telephone Encounter (Signed)
Can I call back patient and give results? I have never done it by pt's request, always done it by providers request. Please advise.

## 2018-10-07 DIAGNOSIS — L299 Pruritus, unspecified: Secondary | ICD-10-CM | POA: Diagnosis not present

## 2018-10-18 DIAGNOSIS — R21 Rash and other nonspecific skin eruption: Secondary | ICD-10-CM | POA: Diagnosis not present

## 2018-11-15 DIAGNOSIS — L299 Pruritus, unspecified: Secondary | ICD-10-CM | POA: Diagnosis not present

## 2018-11-29 ENCOUNTER — Telehealth: Payer: Self-pay | Admitting: Emergency Medicine

## 2018-11-29 ENCOUNTER — Ambulatory Visit: Payer: Self-pay | Admitting: Family Medicine

## 2018-11-29 NOTE — Telephone Encounter (Signed)
Copied from CRM [id number]. Topic: General - Inquiry >> Nov 29, 2018  8:32 AM Jolayne Haines L wrote: Reason for CRM: patient was scheduled to come in for her new patient appt today - since [facility] is not seeing well visits right now patient would like to know could she have a note from Maurice Small stating that she was suppose to be seen today for a new patient appt but had to reschedule due to COVID-19. She is scheduled for May 4th. Call back @ [phone]

## 2018-11-29 NOTE — Telephone Encounter (Signed)
Can she have a note even though she has not been seen in our office

## 2018-11-30 NOTE — Telephone Encounter (Signed)
Patient notified

## 2018-11-30 NOTE — Telephone Encounter (Signed)
No note to be provided - she canceled her appointment, we were/are still seeing well visits in office as of yesterday and today.

## 2018-12-07 ENCOUNTER — Other Ambulatory Visit: Payer: Self-pay

## 2018-12-07 ENCOUNTER — Ambulatory Visit (INDEPENDENT_AMBULATORY_CARE_PROVIDER_SITE_OTHER): Payer: BLUE CROSS/BLUE SHIELD | Admitting: Family Medicine

## 2018-12-07 ENCOUNTER — Encounter: Payer: Self-pay | Admitting: Family Medicine

## 2018-12-07 DIAGNOSIS — L299 Pruritus, unspecified: Secondary | ICD-10-CM | POA: Diagnosis not present

## 2018-12-07 DIAGNOSIS — R7303 Prediabetes: Secondary | ICD-10-CM

## 2018-12-07 DIAGNOSIS — E663 Overweight: Secondary | ICD-10-CM | POA: Diagnosis not present

## 2018-12-07 NOTE — Progress Notes (Signed)
Name: Rose Mcmillan   MRN: [id number]    DOB: [birth year]   Date:12/07/2018       Progress Note  Subjective  Chief Complaint  Chief Complaint  Patient presents with  . Establish Care  . Follow-up    urgent care and [facility] for itching, some times bumps are seen    I connected with [name] on 12/07/18 at  9:20 AM EDT by a video enabled telemedicine application and verified that I am speaking with the correct person using two identifiers.  I discussed the limitations of evaluation and management by telemedicine and the availability of in person appointments. The patient expressed understanding and agreed to proceed. Staff also discussed with the patient that there may be a patient responsible charge related to this service. Patient Location: [facility] Provider Location: Home Additional Individuals present: None  HPI  Pt is establishing care today.  Pruritis: Ever since December 2019 she has been having pruritis on her scalp.  This started after getting some coloring done in her hair.  She scratched so much that her scalp was very sore.  In January her legs and arms also started itching - went to UC and was given a cream to use once a week - did this and it did not help.  Saw [facility] dermatology for when the itch happens (was given permethrin), also added zyrtec.  Zyrtec has been working well at this point. She was also rx'd Gabapentin but has not started yet - wants to wait to see how the Zyrtec does. She does not have any rash right now.   Overweight/Prediabetes:  She had A1C checked in November 2019 - she has 6 month follow up with Helmut Muster Copland coming up.  Last A1C was 6.2%, not taking any medications for this at this time.  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  Is planning to start walking now that she is off of work.  Patient Active Problem List   Diagnosis Date Noted  . Pre-diabetes 09/23/2018  . Dysmenorrhea 06/30/2018    Past Surgical History:  Procedure Laterality Date  . CESAREAN SECTION     x2  . CHOLECYSTECTOMY      Family History  Problem Relation Age of Onset  . Colon cancer Maternal Grandmother 38  . Breast cancer Maternal Aunt        great MAT  . Ovarian cancer Neg Hx     Social History   Socioeconomic History  . Marital status: Married    Spouse name: robin  . Number of children: 2  . Years of education: Not on file  . Highest education level: Not on file  Occupational History  . Not on file  Social Needs  . Financial resource strain: Not hard at all  . Food insecurity:    Worry: Never true    Inability: Never true  . Transportation needs:    Medical: No    Non-medical: No  Tobacco Use  . Smoking status: Never Smoker  . Smokeless tobacco: Never Used  Substance and Sexual Activity  . Alcohol use: No  . Drug use: No  . Sexual activity: Yes    Birth control/protection: Surgical    Comment: tubal ligation  Lifestyle  . Physical activity:    Days per week: 0 days    Minutes per session: 0 min  . Stress: Not at all  Relationships  . Social connections:    Talks on phone: More than three times a week    Gets together: More than three times a week    Attends religious service: 1 to 4 times per year    Active member of club or organization: No    Attends meetings of clubs or organizations: Never    Relationship status: Married  . Intimate partner violence:    Fear of current or ex partner: No    Emotionally abused: No    Physically abused: No    Forced sexual activity: No  Other Topics Concern  . Not on file  Social History Narrative  . Not on file    Current Outpatient Medications:  .  cetirizine (ZYRTEC) 10 MG tablet, Take 10 mg by mouth daily., Disp: , Rfl:    Allergies  Allergen Reactions  . Penicillins Nausea And Vomiting    I personally reviewed active problem list, medication list, allergies, family history, social history, health maintenance, notes from last encounter, lab results with the patient/caregiver today.   ROS Constitutional: Negative for fever or weight change.  Respiratory: Negative for cough and shortness of breath.   Cardiovascular: Negative for chest pain or palpitations.  Gastrointestinal: Negative for abdominal pain, no bowel changes.  Musculoskeletal: Negative for gait problem or joint swelling.  Skin: Negative for rash. See HPI regarding pruritis. Neurological: Negative for dizziness or headache.  No other specific complaints in a complete review of systems (except as listed in HPI above).  Objective  Virtual encounter, vitals not obtained.  There is no height or weight on file to calculate BMI.  Physical Exam  Constitutional: Patient appears well-developed and well-nourished. No distress.  HENT: Head: Normocephalic and atraumatic.  Neck: Normal range of motion. Pulmonary/Chest: Effort normal. No respiratory distress. Speaking in complete sentences Neurological: Pt is alert and oriented to person, place, and time. Coordination, speech and gait are normal.  Psychiatric: Patient has a normal mood and affect. behavior is normal. Judgment and thought content normal. Skin: There is no rash, no erythema.  No results found for this or any previous visit (from the past 72 hour(s)).  PHQ2/9: Depression screen PHQ 2/9 12/07/2018  Decreased Interest 0  Down, Depressed, Hopeless 0  PHQ - 2 Score 0  Altered sleeping 0  Tired, decreased energy 0  Change in appetite 0  Feeling bad or failure about yourself  0  Trouble concentrating 0  Moving slowly or fidgety/restless 0  Suicidal thoughts 0  PHQ-9 Score 0  Difficult doing work/chores Not difficult at all   PHQ-2/9 Result is negative.    Fall Risk: Fall Risk   12/07/2018  Falls in the past year? 0  Number falls in past yr: 0  Injury with Fall? 0  Follow up Falls evaluation completed    Assessment & Plan  1. Pruritic dermatitis Advised to continue zyrtec, PRN triamcinolone, and may start gabapentin if zyrtec is not fulling containing symptoms.  She will give [place] a call if not effective as dermatology copay has been quite high.  2. Overweight Discussed importance of 150 minutes of physical activity weekly, eat two servings of fish weekly, eat one serving of tree nuts ( cashews, pistachios, pecans, almonds.Marland Kitchen) every other day, eat 6 servings of fruit/vegetables daily and drink plenty of water and avoid sweet  beverages.   3. Pre-diabetes - Follow up with Helmut Muster Copland PA-C with gyn - advised we will be happy to take over care of this at any point.  I discussed the assessment and treatment plan with the patient. The patient was provided an opportunity to ask questions and all were answered. The patient agreed with the plan and demonstrated an understanding of the instructions.  The patient was advised to call back or seek an in-person evaluation if the symptoms worsen or if the condition fails to improve as anticipated.  I provided 30 minutes of non-face-to-face time during this encounter.

## 2018-12-30 ENCOUNTER — Encounter: Payer: Self-pay | Admitting: Family Medicine

## 2019-01-10 ENCOUNTER — Ambulatory Visit: Payer: Self-pay | Admitting: Family Medicine

## 2019-02-14 ENCOUNTER — Encounter: Payer: Self-pay | Admitting: Family Medicine

## 2019-02-14 DIAGNOSIS — L509 Urticaria, unspecified: Secondary | ICD-10-CM

## 2019-03-07 ENCOUNTER — Ambulatory Visit: Payer: Self-pay | Admitting: Allergy and Immunology

## 2019-03-21 ENCOUNTER — Encounter: Payer: Self-pay | Admitting: Allergy and Immunology

## 2019-03-21 ENCOUNTER — Ambulatory Visit: Payer: BC Managed Care – PPO | Admitting: Allergy and Immunology

## 2019-03-21 ENCOUNTER — Other Ambulatory Visit: Payer: Self-pay

## 2019-03-21 VITALS — BP 112/88 | HR 97 | Temp 98.1°F | Resp 16 | Ht 64.0 in | Wt 200.6 lb

## 2019-03-21 DIAGNOSIS — T783XXA Angioneurotic edema, initial encounter: Secondary | ICD-10-CM | POA: Insufficient documentation

## 2019-03-21 DIAGNOSIS — T7840XD Allergy, unspecified, subsequent encounter: Secondary | ICD-10-CM | POA: Diagnosis not present

## 2019-03-21 DIAGNOSIS — K2970 Gastritis, unspecified, without bleeding: Secondary | ICD-10-CM | POA: Diagnosis not present

## 2019-03-21 DIAGNOSIS — L299 Pruritus, unspecified: Secondary | ICD-10-CM | POA: Insufficient documentation

## 2019-03-21 DIAGNOSIS — T783XXD Angioneurotic edema, subsequent encounter: Secondary | ICD-10-CM | POA: Diagnosis not present

## 2019-03-21 DIAGNOSIS — L500 Allergic urticaria: Secondary | ICD-10-CM | POA: Insufficient documentation

## 2019-03-21 HISTORY — DX: Angioneurotic edema, initial encounter: T78.3XXA

## 2019-03-21 MED ORDER — FAMOTIDINE 20 MG PO CHEW: 20.0000 mg | CHEWABLE_TABLET | Freq: Two times a day (BID) | ORAL | 5 refills | Status: DC | PRN

## 2019-03-21 MED ORDER — LEVOCETIRIZINE DIHYDROCHLORIDE 2.5 MG/5ML PO SOLN: 5.0000 mg | Freq: Two times a day (BID) | ORAL | 5 refills | Status: DC | PRN

## 2019-03-21 NOTE — Patient Instructions (Addendum)
Allergic urticaria Unclear etiology. Skin tests to select food allergens were negative today. NSAIDs and emotional stress commonly exacerbate urticaria but are not the underlying etiology in this case. Physical urticarias are negative by history (i.e. pressure-induced, temperature, vibration, solar, etc.). History and lesions are not consistent with urticaria pigmentosa so I am not suspicious for mastocytosis. There are no concomitant symptoms concerning for anaphylaxis or constitutional symptoms worrisome for an underlying malignancy. We will rule out other potential etiologies with labs. For symptom relief, patient is to take oral antihistamines as directed.  The following labs have been ordered: FCeRI antibody, anti-thyroglobulin antibody, thyroid peroxidase antibody, tryptase, H. Pylori (IgA, IgM, IgG), CBC, CMP, ESR, ANA, and galactose-alpha-1,3-galactose IgE level.  The patient will be called with further recommendations after lab results have returned.  Instructions have been discussed and provided for H1/H2 receptor blockade with titration to find lowest effective dose.  A prescription has been provided for levocetirizine (Xyzal), 5 mg daily as needed.  To avoid diminishing benefit with daily use (tachyphylaxis) of second generation antihistamine, consider alternating every few months between fexofenadine (Allegra) and levocetirizine (Xyzal).  Famotidine (Pepcid), 20 mg twice daily as needed.  Should there be a significant increase or change in symptoms, a journal is to be kept recording any foods eaten, beverages consumed, medications taken within a 6 hour period prior to the onset of symptoms, as well as record activities being performed, and environmental conditions. For any symptoms concerning for anaphylaxis, 911 is to be called immediately.  Angioedema Associated angioedema occurs in up to 50% of patients with chronic urticaria.  Treatment/diagnostic plan as outlined  above.   When lab results have returned the patient will be called with further recommendations.  Urticaria (Hives)  . Levocetirizine (Xyzal) 5 mg twice a day and famotidine (Pepcid) 20 mg twice a day. If no symptoms for 7-14 days then decrease to. . Levocetirizine (Xyzal) 5 mg twice a day and famotidine (Pepcid) 20 mg once a day.  If no symptoms for 7-14 days then decrease to. . Levocetirizine (Xyzal) 5 mg twice a day.  If no symptoms for 7-14 days then decrease to. . Levocetirizine (Xyzal) 5 mg once a day.  May use Benadryl (diphenhydramine) as needed for breakthrough symptoms       If symptoms return, then step up dosage

## 2019-03-21 NOTE — Assessment & Plan Note (Signed)
Associated angioedema occurs in up to 50% of patients with chronic urticaria.  Treatment/diagnostic plan as outlined above. 

## 2019-03-21 NOTE — Progress Notes (Signed)
  New Patient Note  RE: Rose Mcmillan MRN: [id number] DOB: 01/09/1971 Date of Office Visit: 03/21/2019  Referring provider: Boyce, Emily E, FNP Primary care provider: Boyce, Emily E, FNP  Chief Complaint: Pruritus, Urticaria, and Angioedema   History of present illness: Rose Mcmillan is a 48 y.o. female seen today in consultation requested by Emily Boyce, FNP.  She reports that on August 19, 2018 she had a permanent hair dye treatment.  Approximately 1 to 2 weeks later she noticed scalp pruritus but thought that it was related to 1 of the shampoos or conditioners that she was using.  Therefore, she began changing the shampoos and conditioners without perceived improvement.  In January 2020, the pruritus progressed to her lower legs as well as her arms.  She was seen by dermatologist and prescribed permethrin cream without benefit.  She was also given a another topical medication to be used on pruritic areas as needed which provided mild relief.  She went back to the dermatologist the third time and was started on cetirizine.  While taking cetirizine she does not experience pruritus, however when she discontinues the cetirizine the pruritus returns.  On 3 occasions she has developed urticaria, once on the right thigh and twice under the breasts.  The hives are described as red, raised, and pruritic.  The hives would typically resolve within 24 hours and did not leave residual pigmentation or bruising.  On one occasion, in late May early June 2020, she experienced mild right eyelid swelling in association.  On no occasion has she experience concomitant cardiopulmonary or GI symptoms.  No specific medication, food, skin care product, detergent, soap, or other environmental triggers have been identified.  She has questioned if the pruritus and hives may be related to emotional stress that she has been dealing with. She does not experience significant nasal, ocular, or sinus symptoms.  She  has no history of symptoms consistent with asthma.  Assessment and plan: Allergic urticaria Unclear etiology. Skin tests to select food allergens were negative today. NSAIDs and emotional stress commonly exacerbate urticaria but are not the underlying etiology in this case. Physical urticarias are negative by history (i.e. pressure-induced, temperature, vibration, solar, etc.). History and lesions are not consistent with urticaria pigmentosa so I am not suspicious for mastocytosis. There are no concomitant symptoms concerning for anaphylaxis or constitutional symptoms worrisome for an underlying malignancy. We will rule out other potential etiologies with labs. For symptom relief, patient is to take oral antihistamines as directed.  The following labs have been ordered: FCeRI antibody, anti-thyroglobulin antibody, thyroid peroxidase antibody, tryptase, H. Pylori (IgA, IgM, IgG), CBC, CMP, ESR, ANA, and galactose-alpha-1,3-galactose IgE level.  The patient will be called with further recommendations after lab results have returned.  Instructions have been discussed and provided for H1/H2 receptor blockade with titration to find lowest effective dose.  A prescription has been provided for levocetirizine (Xyzal), 5 mg daily as needed.  To avoid diminishing benefit with daily use (tachyphylaxis) of second generation antihistamine, consider alternating every few months between fexofenadine (Allegra) and levocetirizine (Xyzal).  Famotidine (Pepcid), 20 mg twice daily as needed.  Should there be a significant increase or change in symptoms, a journal is to be kept recording any foods eaten, beverages consumed, medications taken within a 6 hour period prior to the onset of symptoms, as well as record activities being performed, and environmental conditions. For any symptoms concerning for anaphylaxis, 911 is to be called immediately.  Angioedema Associated angioedema   occurs in up to 50% of patients with  chronic urticaria.  Treatment/diagnostic plan as outlined above.   Meds ordered this encounter  Medications  . levocetirizine (XYZAL) 2.5 MG/5ML solution    Sig: Take 10 mLs (5 mg total) by mouth 2 (two) times daily as needed for allergies.    Dispense:  600 mL    Refill:  5  . Famotidine 20 MG CHEW    Sig: Chew 1 tablet (20 mg total) by mouth 2 (two) times daily as needed.    Dispense:  60 tablet    Refill:  5    Diagnostics: Environmental skin testing: Negative despite a positive histamine control. Food allergen skin testing: Negative despite a positive histamine control    Physical examination: Blood pressure 112/88, pulse 97, temperature 98.1 F (36.7 C), temperature source Temporal, resp. rate 16, height 5' 4" (1.626 m), weight 200 lb 9.6 oz (91 kg), SpO2 98 %.  General: Alert, interactive, in no acute distress. HEENT: TMs pearly gray, turbinates mildly edematous without discharge, post-pharynx mildly erythematous. Neck: Supple without lymphadenopathy. Lungs: Clear to auscultation without wheezing, rhonchi or rales. CV: Normal S1, S2 without murmurs. Abdomen: Nondistended, nontender. Skin: Warm and dry, without lesions or rashes. Extremities:  No clubbing, cyanosis or edema. Neuro:   Grossly intact.  Review of systems:  Review of systems negative except as noted in HPI / PMHx or noted below: Review of Systems  Constitutional: Negative.   HENT: Negative.   Eyes: Negative.   Respiratory: Negative.   Cardiovascular: Negative.   Gastrointestinal: Negative.   Genitourinary: Negative.   Musculoskeletal: Negative.   Skin: Negative.   Neurological: Negative.   Endo/Heme/Allergies: Negative.   Psychiatric/Behavioral: Negative.     Past medical history:  Other than issues mentioned in the history of present illness, no chronic diseases or recent hospitalizations have been reported.  Past surgical history:  Past Surgical History:  Procedure Laterality Date  .  CESAREAN SECTION     x2  . CHOLECYSTECTOMY      Family history: Family History  Problem Relation Age of Onset  . Colon cancer Maternal Grandmother 89  . Breast cancer Maternal Aunt        great MAT  . Ovarian cancer Neg Hx     Social history: Social History   Socioeconomic History  . Marital status: Married    Spouse name: robin  . Number of children: 2  . Years of education: Not on file  . Highest education level: Not on file  Occupational History  . Not on file  Social Needs  . Financial resource strain: Not hard at all  . Food insecurity    Worry: Never true    Inability: Never true  . Transportation needs    Medical: No    Non-medical: No  Tobacco Use  . Smoking status: Never Smoker  . Smokeless tobacco: Never Used  Substance and Sexual Activity  . Alcohol use: No  . Drug use: No  . Sexual activity: Yes    Birth control/protection: Surgical    Comment: tubal ligation  Lifestyle  . Physical activity    Days per week: 0 days    Minutes per session: 0 min  . Stress: Not at all  Relationships  . Social connections    Talks on phone: More than three times a week    Gets together: More than three times a week    Attends religious service: 1 to 4 times per year  Active member of club or organization: No    Attends meetings of clubs or organizations: Never    Relationship status: Married  . Intimate partner violence    Fear of current or ex partner: No    Emotionally abused: No    Physically abused: No    Forced sexual activity: No  Other Topics Concern  . Not on file  Social History Narrative  . Not on file   Environmental History: The patient lives in a 4-year-old house with carpeting throughout, gas heat, and central air.  There is no known mold/water damage in the home.  There are no pets in the home.  She is a non-smoker.  Allergies as of 03/21/2019      Reactions   Penicillins Nausea And Vomiting      Medication List       Accurate as of  March 21, 2019  5:57 PM. If you have any questions, ask your nurse or doctor.        cetirizine 10 MG tablet Commonly known as: ZYRTEC Take 10 mg by mouth daily.   Famotidine 20 MG Chew Chew 1 tablet (20 mg total) by mouth 2 (two) times daily as needed. Started by: R Carter Bobbitt, MD   levocetirizine 2.5 MG/5ML solution Commonly known as: XYZAL Take 10 mLs (5 mg total) by mouth 2 (two) times daily as needed for allergies. Started by: R Carter Bobbitt, MD   triamcinolone cream 0.1 % Commonly known as: KENALOG Apply 1 application topically 2 (two) times daily as needed.       Known medication allergies: Allergies  Allergen Reactions  . Penicillins Nausea And Vomiting    I appreciate the opportunity to take part in Shakerra's care. Please do not hesitate to contact me with questions.  Sincerely,   R. Carter Bobbitt, MD 

## 2019-03-21 NOTE — Assessment & Plan Note (Addendum)
Unclear etiology. Skin tests to select food allergens were negative today. NSAIDs and emotional stress commonly exacerbate urticaria but are not the underlying etiology in this case. Physical urticarias are negative by history (i.e. pressure-induced, temperature, vibration, solar, etc.). History and lesions are not consistent with urticaria pigmentosa so I am not suspicious for mastocytosis. There are no concomitant symptoms concerning for anaphylaxis or constitutional symptoms worrisome for an underlying malignancy. We will rule out other potential etiologies with labs. For symptom relief, patient is to take oral antihistamines as directed.  The following labs have been ordered: FCeRI antibody, anti-thyroglobulin antibody, thyroid peroxidase antibody, tryptase, H. Pylori (IgA, IgM, IgG), CBC, CMP, ESR, ANA, and galactose-alpha-1,3-galactose IgE level.  The patient will be called with further recommendations after lab results have returned.  Instructions have been discussed and provided for H1/H2 receptor blockade with titration to find lowest effective dose.  A prescription has been provided for levocetirizine (Xyzal), 5 mg daily as needed.  To avoid diminishing benefit with daily use (tachyphylaxis) of second generation antihistamine, consider alternating every few months between fexofenadine (Allegra) and levocetirizine (Xyzal).  Famotidine (Pepcid), 20 mg twice daily as needed.  Should there be a significant increase or change in symptoms, a journal is to be kept recording any foods eaten, beverages consumed, medications taken within a 6 hour period prior to the onset of symptoms, as well as record activities being performed, and environmental conditions. For any symptoms concerning for anaphylaxis, 911 is to be called immediately.

## 2019-03-30 LAB — CBC WITH DIFFERENTIAL/PLATELET
Basophils Absolute: 0 10*3/uL (ref 0.0–0.2)
Basos: 1 %
EOS (ABSOLUTE): 0.2 10*3/uL (ref 0.0–0.4)
Eos: 2 %
Hematocrit: 37.7 % (ref 34.0–46.6)
Hemoglobin: 12.6 g/dL (ref 11.1–15.9)
Immature Grans (Abs): 0 10*3/uL (ref 0.0–0.1)
Immature Granulocytes: 0 %
Lymphocytes Absolute: 2.4 10*3/uL (ref 0.7–3.1)
Lymphs: 34 %
MCH: 26.3 pg — ABNORMAL LOW (ref 26.6–33.0)
MCHC: 33.4 g/dL (ref 31.5–35.7)
MCV: 79 fL (ref 79–97)
Monocytes Absolute: 0.3 10*3/uL (ref 0.1–0.9)
Monocytes: 5 %
Neutrophils Absolute: 4.1 10*3/uL (ref 1.4–7.0)
Neutrophils: 58 %
Platelets: 414 10*3/uL (ref 150–450)
RBC: 4.79 x10E6/uL (ref 3.77–5.28)
RDW: 13.4 % (ref 11.7–15.4)
WBC: 7 10*3/uL (ref 3.4–10.8)

## 2019-03-30 LAB — COMPREHENSIVE METABOLIC PANEL
ALT: 12 IU/L (ref 0–32)
AST: 16 IU/L (ref 0–40)
Albumin/Globulin Ratio: 1.5 (ref 1.2–2.2)
Albumin: 4.6 g/dL (ref 3.8–4.8)
Alkaline Phosphatase: 88 IU/L (ref 39–117)
BUN/Creatinine Ratio: 12 (ref 9–23)
BUN: 10 mg/dL (ref 6–24)
Bilirubin Total: 0.7 mg/dL (ref 0.0–1.2)
CO2: 25 mmol/L (ref 20–29)
Calcium: 9.4 mg/dL (ref 8.7–10.2)
Chloride: 99 mmol/L (ref 96–106)
Creatinine, Ser: 0.84 mg/dL (ref 0.57–1.00)
GFR calc Af Amer: 95 mL/min/{1.73_m2} (ref >59)
GFR calc non Af Amer: 82 mL/min/{1.73_m2} (ref >59)
Globulin, Total: 3.1 g/dL (ref 1.5–4.5)
Glucose: 115 mg/dL — ABNORMAL HIGH (ref 65–99)
Potassium: 4 mmol/L (ref 3.5–5.2)
Sodium: 139 mmol/L (ref 134–144)
Total Protein: 7.7 g/dL (ref 6.0–8.5)

## 2019-03-30 LAB — ALPHA-GAL PANEL
Alpha Gal IgE*: <0.1 kU/L (ref <0.10)
Beef (Bos spp) IgE: <0.1 kU/L (ref <0.35)
Class Interpretation: 0
Class Interpretation: 0
Class Interpretation: 0
Lamb/Mutton (Ovis spp) IgE: <0.1 kU/L (ref <0.35)
Pork (Sus spp) IgE: <0.1 kU/L (ref <0.35)

## 2019-03-30 LAB — ANA W/REFLEX: Anti Nuclear Antibody (ANA): NEGATIVE

## 2019-03-30 LAB — CHRONIC URTICARIA: cu index: 7.5 (ref <10)

## 2019-03-30 LAB — H PYLORI, IGM, IGG, IGA AB
H pylori, IgM Abs: <9 units (ref 0.0–8.9)
H. pylori, IgA Abs: <9 units (ref 0.0–8.9)
H. pylori, IgG AbS: 0.3 Index Value (ref 0.00–0.79)

## 2019-03-30 LAB — SEDIMENTATION RATE: Sed Rate: 50 mm/hr — ABNORMAL HIGH (ref 0–32)

## 2019-03-30 LAB — THYROID PEROXIDASE ANTIBODY: Thyroperoxidase Ab SerPl-aCnc: 7 IU/mL (ref 0–34)

## 2019-03-30 LAB — TRYPTASE: Tryptase: 6.8 ug/L (ref 2.2–13.2)

## 2019-04-26 ENCOUNTER — Ambulatory Visit (INDEPENDENT_AMBULATORY_CARE_PROVIDER_SITE_OTHER): Payer: BLUE CROSS/BLUE SHIELD | Admitting: Family Medicine

## 2019-04-26 ENCOUNTER — Encounter: Payer: Self-pay | Admitting: Family Medicine

## 2019-04-26 ENCOUNTER — Other Ambulatory Visit: Payer: Self-pay

## 2019-04-26 DIAGNOSIS — E669 Obesity, unspecified: Secondary | ICD-10-CM

## 2019-04-26 DIAGNOSIS — L299 Pruritus, unspecified: Secondary | ICD-10-CM

## 2019-04-26 DIAGNOSIS — L500 Allergic urticaria: Secondary | ICD-10-CM

## 2019-04-26 DIAGNOSIS — R7303 Prediabetes: Secondary | ICD-10-CM

## 2019-04-26 NOTE — Progress Notes (Signed)
Name: Rose Mcmillan   MRN: [id number]    DOB: 03-17-1971   Date:04/26/2019       Progress Note  Subjective  Chief Complaint  Chief Complaint  Patient presents with   Follow-up    4-5 month follow up    I connected with  Rose Mcmillan  on 04/26/19 at  2:00 PM EDT by a video enabled telemedicine application and verified that I am speaking with the correct person using two identifiers.  I discussed the limitations of evaluation and management by telemedicine and the availability of in person appointments. The patient expressed understanding and agreed to proceed. Staff also discussed with the patient that there may be a patient responsible charge related to this service. Patient Location: Home Provider Location: Home Additional Individuals present: none  HPI  Pruritis: Started in December 2019 - has been having pruritis on her scalp.  This started after getting some coloring done in her hair.  In January 2020 her legs and arms also started itching - went to [facility] and was given a cream to use once a week - did this and it did not help.  [facility] happens (was given permethrin), also added zyrtec. She did reach out via MyChart in June 2020 as her symptoms were not improving, so we referred to allergist who saw her about a month ago.  Labs were performed with no clear etiology, alternating antihistamin and adding pepcid 20mg  BID was recommended with titration to lowest effective dose.  She is currently taking Zyrtec daily, not taking famotidine.   Overweight/Prediabetes:  She had A1C checked in November 2019 - she has 6 month follow up with Rose MusterAlicia Copland coming up.  Last A1C was 6.2%, not taking any medications for this at this time.  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 Denies polyphagia, polydipsia, or polyuria.  Encouraged increased exercise and healthy diet.  Patient Active Problem List   Diagnosis Date Noted   Allergic urticaria 03/21/2019   Angioedema 03/21/2019   Pruritus 03/21/2019   Pre-diabetes 09/23/2018   Dysmenorrhea 06/30/2018   Obesity (BMI 30.0-34.9) 05/19/2016    Past Surgical History:  Procedure Laterality Date   CESAREAN SECTION     x2   CHOLECYSTECTOMY      Family History  Problem Relation Age of Onset   Colon cancer Maternal Grandmother 1989   Breast cancer Maternal Aunt        great MAT   Ovarian cancer Neg Hx     Social History   Socioeconomic History   Marital status: Married    Spouse name: robin   Number of children: 2   Years of education: Not on file   Highest education level: Not on file  Occupational History   Not on file  Social Needs   Financial resource strain: Not hard at all   Food insecurity    Worry: Never true    Inability: Never true   Transportation needs    Medical: No    Non-medical: No  Tobacco Use   Smoking status: Never Smoker   Smokeless tobacco: Never Used  Substance and Sexual Activity   Alcohol use: No   Drug use: No   Sexual  activity: Yes    Birth control/protection: Surgical    Comment: tubal ligation  Lifestyle   Physical activity    Days per week: 0 days    Minutes per session: 0 min   Stress: Not at all  Relationships   Social connections    Talks on phone: More than three times a week    Gets together: More than three times a week    Attends religious service: 1 to 4 times per year    Active member of club or organization: No    Attends meetings of clubs or organizations: Never    Relationship status: Married   Intimate partner violence    Fear of current or ex partner: No    Emotionally abused: No    Physically abused: No    Forced sexual activity: No  Other Topics Concern   Not  on file  Social History Narrative   Not on file     Current Outpatient Medications:    levocetirizine (XYZAL) 2.5 MG/5ML solution, Take 10 mLs (5 mg total) by mouth 2 (two) times daily as needed for allergies., Disp: 600 mL, Rfl: 5   triamcinolone cream (KENALOG) 0.1 %, Apply 1 application topically 2 (two) times daily as needed., Disp: , Rfl:    cetirizine (ZYRTEC) 10 MG tablet, Take 10 mg by mouth daily., Disp: , Rfl:    Famotidine 20 MG CHEW, Chew 1 tablet (20 mg total) by mouth 2 (two) times daily as needed. (Patient not taking: Reported on 04/26/2019), Disp: 60 tablet, Rfl: 5  Allergies  Allergen Reactions   Penicillins Nausea And Vomiting    I personally reviewed active problem list, medication list, allergies, health maintenance, notes from last encounter, lab results with the patient/caregiver today.   ROS  Constitutional: Negative for fever or weight change.  Respiratory: Negative for cough and shortness of breath.   Cardiovascular: Negative for chest pain or palpitations.  Gastrointestinal: Negative for abdominal pain, no bowel changes.  Musculoskeletal: Negative for gait problem or joint swelling.  Skin: See HPI Neurological: Negative for dizziness or headache.  No other specific complaints in a complete review of systems (except as listed in HPI above).  Objective  Virtual encounter, vitals not obtained.  There is no height or weight on file to calculate BMI.  Physical Exam Constitutional: Patient appears well-developed and well-nourished. No distress.  HENT: Head: Normocephalic and atraumatic.  Neck: Normal range of motion. Pulmonary/Chest: Effort normal. No respiratory distress. Speaking in complete sentences Neurological: Pt is alert and oriented to person, place, and time. Coordination, speech and are normal.  Psychiatric: Patient has a normal mood and affect. behavior is normal. Judgment and thought content normal.  No results found for this or any  previous visit (from the past 72 hour(s)).  PHQ2/9: Depression screen [facility] 2/9 04/26/2019 12/07/2018  Decreased Interest 1 0  Down, Depressed, Hopeless 0 0  PHQ - 2 Score 1 0  Altered sleeping 1 0  Tired, decreased energy 3 0  Change in appetite 0 0  Feeling bad or failure about yourself  0 0  Trouble concentrating 2 0  Moving slowly or fidgety/restless 0 0  Suicidal thoughts 0 0  PHQ-9 Score 7 0  Difficult doing work/chores Somewhat difficult Not difficult at all   PHQ-2/9 Result is positive.    Fall Risk: Fall Risk  04/26/2019 12/07/2018  Falls in the past year? 0 0  Number falls in past yr: 0 0  Injury with Fall? 0  0  Follow up - Falls evaluation completed    Assessment & Plan  1. Allergic urticaria 2. Pruritus - Saw allergist, doing much better with antihistamine; not needing pepcid at this time; encouraged her to switch from Xyzal to zyrtec/alternate meds every 3-4 months to maintain efficacy.  3. Obesity (BMI 30.0-34.9) - Discussed importance of 150 minutes of physical activity weekly, eat two servings of fish weekly, eat one serving of tree nuts ( cashews, pistachios, pecans, almonds.Marland Kitchen.) every other day, eat 6 servings of fruit/vegetables daily and drink plenty of water and avoid sweet beverages.   4. Pre-diabetes - COMPLETE METABOLIC PANEL WITH GFR - Hemoglobin A1c  I discussed the assessment and treatment plan with the patient. The patient was provided an opportunity to ask questions and all were answered. The patient agreed with the plan and demonstrated an understanding of the instructions.  The patient was advised to call back or seek an in-person evaluation if the symptoms worsen or if the condition fails to improve as anticipated.  I provided 14 minutes of non-face-to-face time during this encounter.

## 2019-10-07 ENCOUNTER — Other Ambulatory Visit: Payer: Self-pay | Admitting: Obstetrics and Gynecology

## 2019-10-07 DIAGNOSIS — Z1231 Encounter for screening mammogram for malignant neoplasm of breast: Secondary | ICD-10-CM

## 2019-10-26 ENCOUNTER — Ambulatory Visit (INDEPENDENT_AMBULATORY_CARE_PROVIDER_SITE_OTHER): Payer: BC Managed Care – PPO | Admitting: Obstetrics and Gynecology

## 2019-10-26 ENCOUNTER — Other Ambulatory Visit: Payer: Self-pay

## 2019-10-26 ENCOUNTER — Encounter: Payer: Self-pay | Admitting: Obstetrics and Gynecology

## 2019-10-26 VITALS — BP 120/90 | Ht 64.0 in | Wt 204.0 lb

## 2019-10-26 DIAGNOSIS — Z01419 Encounter for gynecological examination (general) (routine) without abnormal findings: Secondary | ICD-10-CM

## 2019-10-26 DIAGNOSIS — Z1231 Encounter for screening mammogram for malignant neoplasm of breast: Secondary | ICD-10-CM

## 2019-10-26 NOTE — Patient Instructions (Signed)
I value your feedback and entrusting us with your care. If you get a [facility] patient survey, I would appreciate you taking the time to let us know about your experience today. Thank you!  As of August 18, 2019, your lab results will be released to your MyChart immediately, before I even have a chance to see them. Please give me time to review them and contact you if there are any abnormalities. Thank you for your patience.  

## 2019-10-26 NOTE — Progress Notes (Signed)
PCP:  Doren Custard, FNP   Chief Complaint  Patient presents with  . Gynecologic Exam     HPI:      [name] is a 49 y.o. [id number] who LMP was Patient's last menstrual period was 10/19/2019 (exact date)., presents today for her annual examination.  Her menses are regular every 28-30 days, lasting 5 days.  Dysmenorrhea mild in low back for the past yr or so, improved with NSAIDs. She does not have intermenstrual bleeding. No vasomotor sx.  Sex activity: single partner, contraception - tubal ligation.  Last Pap: 06/30/18 Results: no abnormalities/neg HPV DNA Hx of STDs: none  Last mammogram: 08/16/18 Results were normal, repeat in 12 months. Has appt 11/03/19. There is a FH of breast cancer in her mat grt aunt and cousin, genetic testing not indicated. There is no FH of ovarian cancer. The patient does do self-breast exams.  Tobacco use: The patient denies current or previous tobacco use. Alcohol use: none No drug use.  Exercise: not active  She does get adequate calcium and Vitamin D in her diet. Hx of pre-DM 2019 labs. Has PCP now   Past Medical History:  Diagnosis Date  . Allergic urticaria   . Pre-diabetes     Past Surgical History:  Procedure Laterality Date  . CESAREAN SECTION     x2  . CHOLECYSTECTOMY      Family History  Problem Relation Age of Onset  . Colon cancer Maternal Grandmother 42  . Breast cancer Other        70s  . Breast cancer Cousin 50  . Ovarian cancer Neg Hx     Social History   Socioeconomic History  . Marital status: Married    Spouse name: robin  . Number of children: 2  . Years of education: Not on file  . Highest education level: Not on file  Occupational History  . Not on file  Tobacco Use  . Smoking status: Never Smoker  . Smokeless tobacco: Never Used  Substance and Sexual Activity  . Alcohol use: No  . Drug use: No  . Sexual activity: Yes    Birth control/protection: Surgical    Comment: tubal ligation   Other Topics Concern  . Not on file  Social History Narrative  . Not on file   Social Determinants of Health   Financial Resource Strain: Low Risk   . Difficulty of Paying Living Expenses: Not hard at all  Food Insecurity: No Food Insecurity  . Worried About Programme researcher, broadcasting/film/video in the Last Year: Never true  . Ran Out of Food in the Last Year: Never true  Transportation Needs: No Transportation Needs  . Lack of Transportation (Medical): No  . Lack of Transportation (Non-Medical): No  Physical Activity: Inactive  . Days of Exercise per Week: 0 days  . Minutes of Exercise per Session: 0 min  Stress: No Stress Concern Present  . Feeling of Stress : Not at all  Social Connections: Slightly Isolated  . Frequency of Communication with Friends and Family: More than three times a week  . Frequency of Social Gatherings with Friends and Family: More than three times a week  . Attends Religious Services: 1 to 4 times per year  . Active Member of Clubs or Organizations: No  . Attends Banker Meetings: Never  . Marital Status: Married  Catering manager Violence: Not At Risk  . Fear of Current or Ex-Partner: No  . Emotionally  Abused: No  . Physically Abused: No  . Sexually Abused: No    Outpatient Medications Prior to Visit  Medication Sig Dispense Refill  . cetirizine (ZYRTEC) 10 MG tablet Take 10 mg by mouth daily.    Marland Kitchen levocetirizine (XYZAL) 2.5 MG/5ML solution Take 10 mLs (5 mg total) by mouth 2 (two) times daily as needed for allergies. 600 mL 5  . triamcinolone cream (KENALOG) 0.1 % Apply 1 application topically 2 (two) times daily as needed.     No facility-administered medications prior to visit.    ROS:  Review of Systems  Constitutional: Negative for fatigue, fever and unexpected weight change.  Respiratory: Negative for cough, shortness of breath and wheezing.   Cardiovascular: Negative for chest pain, palpitations and leg swelling.  Gastrointestinal:  Negative for blood in stool, constipation, diarrhea, nausea and vomiting.  Endocrine: Negative for cold intolerance, heat intolerance and polyuria.  Genitourinary: Negative for dyspareunia, dysuria, flank pain, frequency, genital sores, hematuria, menstrual problem, pelvic pain, urgency, vaginal bleeding, vaginal discharge and vaginal pain.  Musculoskeletal: Negative for back pain, joint swelling and myalgias.  Skin: Negative for rash.  Neurological: Negative for dizziness, syncope, light-headedness, numbness and headaches.  Hematological: Negative for adenopathy.  Psychiatric/Behavioral: Negative for agitation, confusion, sleep disturbance and suicidal ideas. The patient is not nervous/anxious.   BREAST: No symptoms   Objective: BP 120/90   Ht 5\' 4"  (1.626 m)   Wt 204 lb (92.5 kg)   LMP 10/19/2019 (Exact Date)   BMI 35.02 kg/m    Physical Exam Constitutional:      Appearance: She is well-developed.  Genitourinary:     Vulva, vagina, uterus, right adnexa and left adnexa normal.     No vulval lesion or tenderness noted.     No vaginal discharge, erythema or tenderness.     No cervical motion tenderness or polyp.     Uterus is not enlarged or tender.     No right or left adnexal mass present.     Right adnexa not tender.     Left adnexa not tender.  Neck:     Thyroid: No thyromegaly.  Cardiovascular:     Rate and Rhythm: Normal rate and regular rhythm.     Heart sounds: Normal heart sounds. No murmur.  Pulmonary:     Effort: Pulmonary effort is normal.     Breath sounds: Normal breath sounds.  Chest:     Breasts:        Right: No mass, nipple discharge, skin change or tenderness.        Left: No mass, nipple discharge, skin change or tenderness.  Abdominal:     Palpations: Abdomen is soft.     Tenderness: There is no abdominal tenderness. There is no guarding.  Musculoskeletal:        General: Normal range of motion.     Cervical back: Normal range of motion.   Neurological:     General: No focal deficit present.     Mental Status: She is alert and oriented to person, place, and time.     Cranial Nerves: No cranial nerve deficit.  Skin:    General: Skin is warm and dry.  Psychiatric:        Mood and Affect: Mood normal.        Behavior: Behavior normal.        Thought Content: Thought content normal.        Judgment: Judgment normal.  Vitals reviewed.  Assessment/Plan: Encounter for annual routine gynecological examination  Encounter for screening mammogram for malignant neoplasm of breast--pt has mammo sched          GYN counsel breast self exam, mammography screening, adequate intake of calcium and vitamin D, diet and exercise     F/U  Return in about 1 year (around 10/25/2020).  Alicia B. Copland, PA-C 10/26/2019 3:35 PM

## 2019-11-03 ENCOUNTER — Ambulatory Visit
Admission: RE | Admit: 2019-11-03 | Discharge: 2019-11-03 | Disposition: A | Discharge status: Home / Self Care | Payer: BC Managed Care – PPO | Source: Ambulatory Visit | Attending: Obstetrics and Gynecology | Admitting: Obstetrics and Gynecology

## 2019-11-03 DIAGNOSIS — Z1231 Encounter for screening mammogram for malignant neoplasm of breast: Secondary | ICD-10-CM

## 2019-11-04 ENCOUNTER — Encounter: Payer: Self-pay | Admitting: Obstetrics and Gynecology

## 2019-12-02 ENCOUNTER — Ambulatory Visit: Payer: No Typology Code available for payment source

## 2019-12-08 ENCOUNTER — Ambulatory Visit: Payer: No Typology Code available for payment source | Attending: Internal Medicine

## 2019-12-08 ENCOUNTER — Other Ambulatory Visit: Payer: Self-pay

## 2019-12-08 DIAGNOSIS — Z23 Encounter for immunization: Secondary | ICD-10-CM

## 2019-12-08 NOTE — Progress Notes (Signed)
   Covid-19 Vaccination Clinic  Name:  Secret Kristensen    MRN: [id number] DOB: 11/11/1970  12/08/2019  Ms. Cutsforth was observed post Covid-19 immunization for 15 minutes without incident. She was provided with Vaccine Information Sheet and instruction to access the V-Safe system.   Ms. Othman was instructed to call 911 with any severe reactions post vaccine: Marland Kitchen Difficulty breathing  . Swelling of face and throat  . A fast heartbeat  . A bad rash all over body  . Dizziness and weakness   Immunizations Administered    Name Date Dose VIS Date Route   Pfizer COVID-19 Vaccine 12/08/2019 11:41 AM 0.3 mL 08/19/2019 Intramuscular   Manufacturer: [employer], [employer]   Lot: [id number]   NDC: [address]-0

## 2020-01-03 ENCOUNTER — Ambulatory Visit: Payer: No Typology Code available for payment source | Attending: Internal Medicine

## 2020-01-03 DIAGNOSIS — Z23 Encounter for immunization: Secondary | ICD-10-CM

## 2020-01-03 NOTE — Progress Notes (Signed)
   Covid-19 Vaccination Clinic  Name:  Rose Mcmillan    MRN: [id number] DOB: 1970-11-08  01/03/2020  [name] was observed post Covid-19 immunization for 15 minutes without incident. She was provided with Vaccine Information Sheet and instruction to access the V-Safe system.   [name] was instructed to call 911 with any severe reactions post vaccine: Marland Kitchen Difficulty breathing  . Swelling of face and throat  . A fast heartbeat  . A bad rash all over body  . Dizziness and weakness   Immunizations Administered    Name Date Dose VIS Date Route   Pfizer COVID-19 Vaccine 01/03/2020 11:42 AM 0.3 mL 11/02/2018 Intramuscular   Manufacturer: [employer], [employer]   Lot: [id number]   NDC: [address]-9

## 2020-02-21 ENCOUNTER — Ambulatory Visit (INDEPENDENT_AMBULATORY_CARE_PROVIDER_SITE_OTHER): Payer: BC Managed Care – PPO | Admitting: Family Medicine

## 2020-02-21 ENCOUNTER — Encounter: Payer: Self-pay | Admitting: Family Medicine

## 2020-02-21 ENCOUNTER — Other Ambulatory Visit: Payer: Self-pay

## 2020-02-21 VITALS — BP 118/80 | HR 95 | Temp 97.3°F | Resp 18 | Ht 64.0 in | Wt 207.6 lb

## 2020-02-21 DIAGNOSIS — R7303 Prediabetes: Secondary | ICD-10-CM

## 2020-02-21 DIAGNOSIS — Z6835 Body mass index (BMI) 35.0-35.9, adult: Secondary | ICD-10-CM

## 2020-02-21 DIAGNOSIS — L500 Allergic urticaria: Secondary | ICD-10-CM | POA: Diagnosis not present

## 2020-02-21 DIAGNOSIS — E669 Obesity, unspecified: Secondary | ICD-10-CM

## 2020-02-21 NOTE — Progress Notes (Signed)
Name: Rose Mcmillan   MRN: [id number]    DOB: 16-Mar-1971   Date:02/21/2020       Progress Note  Chief Complaint  Patient presents with  . Follow-up    lab screening  . Injections    rigth side pain on and off at Covid injection site     Subjective:   Rose Mcmillan is a 49 y.o. female, presents to clinic for routine follow up on the conditions listed above.  Here for f/up labs  She does most of her labs with OBGYN, reivewed labs today over the past couple years and she has lab work significant for prediabetes   She has idiopathic rash a few years ago managed by antihistamines  Recent pertinent labs: Lab Results  Component Value Date   HGBA1C 6.2 (H) 08/04/2018    Lab Results  Component Value Date   CHOL 143 08/04/2018   HDL 67 08/04/2018   LDLCALC 60 08/04/2018   TRIG 80 08/04/2018   CHOLHDL 2.1 08/04/2018    Some weight gain over the past couple years  Wt Readings from Last 5 Encounters:  02/21/20 207 lb 9.6 oz (94.2 kg)  10/26/19 204 lb (92.5 kg)  03/21/19 200 lb 9.6 oz (91 kg)  06/30/18 193 lb (87.5 kg)  12/21/16 175 lb (79.4 kg)   BMI Readings from Last 5 Encounters:  02/21/20 35.63 kg/m  10/26/19 35.02 kg/m  03/21/19 34.43 kg/m  06/30/18 33.13 kg/m  12/21/16 30.04 kg/m      Patient Active Problem List   Diagnosis Date Noted  . Allergic urticaria 03/21/2019  . Angioedema 03/21/2019  . Pruritus 03/21/2019  . Pre-diabetes 09/23/2018  . Dysmenorrhea 06/30/2018  . Obesity (BMI 30.0-34.9) 05/19/2016    Past Surgical History:  Procedure Laterality Date  . CESAREAN SECTION     x2  . CHOLECYSTECTOMY      Family History  Problem Relation Age of Onset  . Colon cancer Maternal Grandmother 27  . Breast cancer Other        70s  . Breast cancer Cousin 50  . Ovarian cancer Neg Hx     Social History   Tobacco Use  . Smoking status: Never Smoker  . Smokeless tobacco: Never Used  Vaping Use  . Vaping Use: Never used    Substance Use Topics  . Alcohol use: No  . Drug use: No      Current Outpatient Medications:  .  levocetirizine (XYZAL) 2.5 MG/5ML solution, Take 10 mLs (5 mg total) by mouth 2 (two) times daily as needed for allergies., Disp: 600 mL, Rfl: 5 .  cetirizine (ZYRTEC) 10 MG tablet, Take 10 mg by mouth daily. (Patient not taking: Reported on 02/21/2020), Disp: , Rfl:   Allergies  Allergen Reactions  . Penicillins Nausea And Vomiting    Chart Review Today: I personally reviewed active problem list, medication list, allergies, family history, social history, health maintenance, notes from last encounter, lab results, imaging with the patient/caregiver today.   Review of Systems  10 Systems reviewed and are negative for acute change except as noted in the HPI.    Objective:    Vitals:   02/21/20 1358  BP: 118/80  Pulse: 95  Resp: 18  Temp: (!) 97.3 F (36.3 C)  TempSrc: Temporal  SpO2: 99%  Weight: 207 lb 9.6 oz (94.2 kg)  Height: 5\' 4"  (1.626 m)    Body mass index is 35.63 kg/m.  Physical Exam Vitals and nursing note reviewed.  Constitutional:      General: She is not in acute distress.    Appearance: Normal appearance. She is well-developed. She is obese. She is not ill-appearing, toxic-appearing or diaphoretic.     Interventions: Face mask in place.  HENT:     Head: Normocephalic and atraumatic.     Right Ear: External ear normal.     Left Ear: External ear normal.  Eyes:     General: Lids are normal. No scleral icterus.       Right eye: No discharge.        Left eye: No discharge.     Conjunctiva/sclera: Conjunctivae normal.  Neck:     Trachea: Phonation normal. No tracheal deviation.  Cardiovascular:     Rate and Rhythm: Normal rate and regular rhythm.     Pulses: Normal pulses.          Radial pulses are 2+ on the right side and 2+ on the left side.       Posterior tibial pulses are 2+ on the right side and 2+ on the left side.     Heart sounds: Normal  heart sounds. No murmur heard.  No friction rub. No gallop.   Pulmonary:     Effort: Pulmonary effort is normal. No respiratory distress.     Breath sounds: Normal breath sounds. No stridor. No wheezing, rhonchi or rales.  Chest:     Chest wall: No tenderness.  Abdominal:     General: Bowel sounds are normal. There is no distension.     Palpations: Abdomen is soft.     Tenderness: There is no abdominal tenderness. There is no guarding or rebound.  Musculoskeletal:     Cervical back: Normal range of motion and neck supple.     Right lower leg: No edema.     Left lower leg: No edema.  Lymphadenopathy:     Cervical: No cervical adenopathy.  Skin:    General: Skin is warm and dry.     Coloration: Skin is not jaundiced or pale.     Findings: No rash.  Neurological:     Mental Status: She is alert.     Motor: No abnormal muscle tone.     Gait: Gait normal.  Psychiatric:        Mood and Affect: Mood normal.        Speech: Speech normal.        Behavior: Behavior normal.       PHQ2/9: Depression screen [facility] 2/9 02/21/2020 04/26/2019 12/07/2018  Decreased Interest 0 1 0  Down, Depressed, Hopeless 0 0 0  PHQ - 2 Score 0 1 0  Altered sleeping 0 1 0  Tired, decreased energy 0 3 0  Change in appetite 0 0 0  Feeling bad or failure about yourself  0 0 0  Trouble concentrating 0 2 0  Moving slowly or fidgety/restless 0 0 0  Suicidal thoughts 0 0 0  PHQ-9 Score 0 7 0  Difficult doing work/chores Not difficult at all Somewhat difficult Not difficult at all    phq 9 is neg, reviewed  Fall Risk: Fall Risk  02/21/2020 04/26/2019 12/07/2018  Falls in the past year? 0 0 0  Number falls in past yr: 0 0 0  Injury with Fall? 0 0 0  Follow up Falls evaluation completed - Falls evaluation completed     Assessment & Plan:    1. Pre-diabetes Recheck A1c, has gained some weight, encouraged calorie restriction, avoiding sweets,  diet high in carbs - COMPLETE METABOLIC PANEL WITH GFR -  Hemoglobin A1c   2. Allergic urticaria Managed with antihistamines, currently well controlled Discussed idiopathic hives and rashes -often unable to find the cause but goal is symptom control encouraged her to use a second-generation antihistamine, she can increase this to 10 mg twice a day if her symptoms are bothersome and she can also add over-the-counter Pepcid 20 mg up to twice daily for breakthrough rash or hives she can take Benadryl as needed 25 to 50 mg every 4 hours as needed for severe itching and rash, maximum 300 mg per 24 hours - TSH  3. Class 2 obesity with body mass index (BMI) of 35.0 to 35.9 in adult, unspecified obesity type, unspecified whether serious comorbidity present Check for HLD, metabolic syndrome  - COMPLETE METABOLIC PANEL WITH GFR - Lipid panel - Hemoglobin A1c - TSH  Results patient was invited to return for medical weight management or we can refer her to dietitian or a subspecialist   No follow-ups on file.   Delsa Grana, PA-C 02/21/20 2:18 PM

## 2020-04-08 ENCOUNTER — Other Ambulatory Visit: Payer: Self-pay

## 2020-04-08 ENCOUNTER — Emergency Department
Admission: EM | Admit: 2020-04-08 | Discharge: 2020-04-08 | Disposition: A | Discharge status: Home / Self Care | Payer: BC Managed Care – PPO | Attending: Emergency Medicine | Admitting: Emergency Medicine

## 2020-04-08 ENCOUNTER — Encounter: Payer: Self-pay | Admitting: Emergency Medicine

## 2020-04-08 ENCOUNTER — Emergency Department: Payer: BC Managed Care – PPO

## 2020-04-08 DIAGNOSIS — R000 Tachycardia, unspecified: Secondary | ICD-10-CM | POA: Diagnosis not present

## 2020-04-08 DIAGNOSIS — J9809 Other diseases of bronchus, not elsewhere classified: Secondary | ICD-10-CM | POA: Diagnosis not present

## 2020-04-08 DIAGNOSIS — J181 Lobar pneumonia, unspecified organism: Secondary | ICD-10-CM | POA: Insufficient documentation

## 2020-04-08 DIAGNOSIS — I2510 Atherosclerotic heart disease of native coronary artery without angina pectoris: Secondary | ICD-10-CM | POA: Diagnosis not present

## 2020-04-08 DIAGNOSIS — R509 Fever, unspecified: Secondary | ICD-10-CM | POA: Diagnosis not present

## 2020-04-08 DIAGNOSIS — J189 Pneumonia, unspecified organism: Secondary | ICD-10-CM

## 2020-04-08 DIAGNOSIS — Z20822 Contact with and (suspected) exposure to covid-19: Secondary | ICD-10-CM | POA: Insufficient documentation

## 2020-04-08 LAB — COMPREHENSIVE METABOLIC PANEL
ALT: 44 U/L (ref 0–44)
AST: 70 U/L — ABNORMAL HIGH (ref 15–41)
Albumin: 3.7 g/dL (ref 3.5–5.0)
Alkaline Phosphatase: 109 U/L (ref 38–126)
Anion gap: 12 (ref 5–15)
BUN: 11 mg/dL (ref 6–20)
CO2: 22 mmol/L (ref 22–32)
Calcium: 8.7 mg/dL — ABNORMAL LOW (ref 8.9–10.3)
Chloride: 98 mmol/L (ref 98–111)
Creatinine, Ser: 1 mg/dL (ref 0.44–1.00)
GFR calc Af Amer: >60 mL/min (ref >60)
GFR calc non Af Amer: >60 mL/min (ref >60)
Glucose, Bld: 165 mg/dL — ABNORMAL HIGH (ref 70–99)
Potassium: 3.2 mmol/L — ABNORMAL LOW (ref 3.5–5.1)
Sodium: 132 mmol/L — ABNORMAL LOW (ref 135–145)
Total Bilirubin: 1 mg/dL (ref 0.3–1.2)
Total Protein: 8.4 g/dL — ABNORMAL HIGH (ref 6.5–8.1)

## 2020-04-08 LAB — CBC WITH DIFFERENTIAL/PLATELET
Abs Immature Granulocytes: 0.04 10*3/uL (ref 0.00–0.07)
Basophils Absolute: 0.1 10*3/uL (ref 0.0–0.1)
Basophils Relative: 1 %
Eosinophils Absolute: 0.1 10*3/uL (ref 0.0–0.5)
Eosinophils Relative: 1 %
HCT: 35.3 % — ABNORMAL LOW (ref 36.0–46.0)
Hemoglobin: 11.8 g/dL — ABNORMAL LOW (ref 12.0–15.0)
Immature Granulocytes: 0 %
Lymphocytes Relative: 17 %
Lymphs Abs: 1.8 10*3/uL (ref 0.7–4.0)
MCH: 25.9 pg — ABNORMAL LOW (ref 26.0–34.0)
MCHC: 33.4 g/dL (ref 30.0–36.0)
MCV: 77.6 fL — ABNORMAL LOW (ref 80.0–100.0)
Monocytes Absolute: 0.6 10*3/uL (ref 0.1–1.0)
Monocytes Relative: 5 %
Neutro Abs: 8.3 10*3/uL — ABNORMAL HIGH (ref 1.7–7.7)
Neutrophils Relative %: 76 %
Platelets: 366 10*3/uL (ref 150–400)
RBC: 4.55 MIL/uL (ref 3.87–5.11)
RDW: 13.9 % (ref 11.5–15.5)
WBC: 11 10*3/uL — ABNORMAL HIGH (ref 4.0–10.5)
nRBC: 0 % (ref 0.0–0.2)

## 2020-04-08 LAB — URINALYSIS, COMPLETE (UACMP) WITH MICROSCOPIC
Bacteria, UA: NONE SEEN
Glucose, UA: NEGATIVE mg/dL
Ketones, ur: NEGATIVE mg/dL
Nitrite: NEGATIVE
Protein, ur: 100 mg/dL — AB
RBC / HPF: >50 RBC/hpf — ABNORMAL HIGH (ref 0–5)
Specific Gravity, Urine: 1.042 — ABNORMAL HIGH (ref 1.005–1.030)
WBC, UA: >50 WBC/hpf — ABNORMAL HIGH (ref 0–5)
pH: 5 (ref 5.0–8.0)

## 2020-04-08 LAB — POC SARS CORONAVIRUS 2 AG: SARS Coronavirus 2 Ag: NEGATIVE

## 2020-04-08 LAB — POCT PREGNANCY, URINE: Preg Test, Ur: NEGATIVE

## 2020-04-08 LAB — LACTIC ACID, PLASMA
Lactic Acid, Venous: 2.2 mmol/L (ref 0.5–1.9)
Lactic Acid, Venous: 1.1 mmol/L (ref 0.5–1.9)

## 2020-04-08 LAB — SARS CORONAVIRUS 2 BY RT PCR (HOSPITAL ORDER, PERFORMED IN ~~LOC~~ HOSPITAL LAB): SARS Coronavirus 2: NEGATIVE

## 2020-04-08 MED ORDER — SODIUM CHLORIDE 0.9 % IV SOLN
1000.0000 mL | Freq: Once | INTRAVENOUS | Status: AC
  Administered 2020-04-08: 1000 mL via INTRAVENOUS

## 2020-04-08 MED ORDER — CEFPODOXIME PROXETIL 200 MG PO TABS: 200.0000 mg | ORAL_TABLET | Freq: Two times a day (BID) | ORAL | 0 refills | Status: DC

## 2020-04-08 MED ORDER — SODIUM CHLORIDE 0.9 % IV SOLN
1.0000 g | Freq: Once | INTRAVENOUS | Status: AC
  Administered 2020-04-08: 1 g via INTRAVENOUS
  Filled 2020-04-08: qty 10

## 2020-04-08 MED ORDER — KETOROLAC TROMETHAMINE 30 MG/ML IJ SOLN
30.0000 mg | Freq: Once | INTRAMUSCULAR | Status: AC
  Administered 2020-04-08: 30 mg via INTRAVENOUS
  Filled 2020-04-08: qty 1

## 2020-04-08 MED ORDER — AZITHROMYCIN 250 MG PO TABS: ORAL_TABLET | ORAL | 0 refills | Status: AC

## 2020-04-08 NOTE — ED Notes (Signed)
Pt given ginger ale per MD 

## 2020-04-08 NOTE — ED Notes (Signed)
IV fluid still infusing at this time and pt updated about wait.

## 2020-04-08 NOTE — ED Notes (Signed)
Pt to CT at this time.

## 2020-04-08 NOTE — ED Provider Notes (Signed)
[facility] Emergency Department Provider Note   ____________________________________________    I have reviewed the triage vital signs and the nursing notes.   HISTORY  Chief Complaint Fever and Cough     HPI Rose Mcmillan is a 49 y.o. female who presents with complaints of fever and cough.  Patient reports symptoms been ongoing for 2 to 3 days.  She reports fatigue and weakness as well.  Dry cough noted.  Mild shortness of breath.  Has been vaccinated against COVID-19.  No sick contacts reported.  No nausea or vomiting.  No recent travel.  No calf pain or swelling.  No pleurisy.  Has not taken anything for this.  Past Medical History:  Diagnosis Date  . Allergic urticaria   . Pre-diabetes     Patient Active Problem List   Diagnosis Date Noted  . Allergic urticaria 03/21/2019  . Angioedema 03/21/2019  . Pruritus 03/21/2019  . Pre-diabetes 09/23/2018  . Dysmenorrhea 06/30/2018  . Class 2 obesity with body mass index (BMI) of 35.0 to 35.9 in adult 05/19/2016    Past Surgical History:  Procedure Laterality Date  . CESAREAN SECTION     x2  . CHOLECYSTECTOMY      Prior to Admission medications   Medication Sig Start Date End Date Taking? Authorizing Provider  cetirizine HCl (ZYRTEC) 5 MG/5ML SOLN Take 10 mg by mouth daily.    Yes [provider]  levocetirizine (XYZAL) 2.5 MG/5ML solution Take 10 mLs (5 mg total) by mouth 2 (two) times daily as needed for allergies. 03/21/19  Yes Bobbitt, Heywood Iles, MD  azithromycin (ZITHROMAX Z-PAK) 250 MG tablet Take 2 tablets (500 mg) on  Day 1,  followed by 1 tablet (250 mg) once daily on Days 2 through 5. 04/08/20 04/13/20  Jene Every, MD  cefpodoxime (VANTIN) 200 MG tablet Take 1 tablet (200 mg total) by mouth 2 (two) times daily. 04/08/20   Jene Every, MD     Allergies Penicillins  Family History  Problem Relation Age of Onset  . Colon cancer Maternal Grandmother 2  . Breast  cancer Other        70s  . Breast cancer Cousin 50  . Ovarian cancer Neg Hx     Social History Social History   Tobacco Use  . Smoking status: Never Smoker  . Smokeless tobacco: Never Used  Vaping Use  . Vaping Use: Never used  Substance Use Topics  . Alcohol use: No  . Drug use: No    Review of Systems  Constitutional: As above Eyes: No visual changes.  ENT: No sore throat. Cardiovascular: Denies chest pain. Respiratory: As above Gastrointestinal: No abdominal pain.   Genitourinary: Negative for dysuria. Musculoskeletal: Negative for back pain. Skin: Negative for rash. Neurological: Negative for headaches   ____________________________________________   PHYSICAL EXAM:  VITAL SIGNS: ED Triage Vitals  Enc Vitals Group     BP 04/08/20 1912 125/75     Pulse Rate 04/08/20 1912 (!) 126     Resp 04/08/20 1912 18     Temp 04/08/20 1912 (!) 102.9 F (39.4 C)     Temp Source 04/08/20 1912 Oral     SpO2 04/08/20 1912 97 %     Weight 04/08/20 1912 91.6 kg (202 lb)     Height 04/08/20 1912 1.626 m (5\' 4" )     Head Circumference --      Peak Flow --      Pain Score 04/08/20  1918 0     Pain Loc --      Pain Edu? --      Excl. in GC? --     Constitutional: Alert and oriented.  interactive  Nose: No congestion/rhinnorhea. mbranes are moist.   Neck:  Painless ROM Cardiovascular: Tachycardia, regular rhythm.  Good peripheral circulation. Respiratory: Normal respiratory effort.  No retractions.  Gastrointestinal: Soft and nontender. No distention.  No CVA tenderness.  Musculoskeletal: No lower extremity tenderness nor edema.  Warm and well perfused Neurologic:  Normal speech and language. No gross focal neurologic deficits are appreciated.  Skin:  Skin is warm, dry and intact. No rash noted. Psychiatric: Mood and affect are normal. Speech and behavior are normal.  ____________________________________________   LABS (all labs ordered are listed, but only  abnormal results are displayed)  Labs Reviewed  LACTIC ACID, PLASMA - Abnormal; Notable for the following components:      Result Value   Lactic Acid, Venous 2.2 (*)    All other components within normal limits  COMPREHENSIVE METABOLIC PANEL - Abnormal; Notable for the following components:   Sodium 132 (*)    Potassium 3.2 (*)    Glucose, Bld 165 (*)    Calcium 8.7 (*)    Total Protein 8.4 (*)    AST 70 (*)    All other components within normal limits  CBC WITH DIFFERENTIAL/PLATELET - Abnormal; Notable for the following components:   WBC 11.0 (*)    Hemoglobin 11.8 (*)    HCT 35.3 (*)    MCV 77.6 (*)    MCH 25.9 (*)    Neutro Abs 8.3 (*)    All other components within normal limits  URINALYSIS, COMPLETE (UACMP) WITH MICROSCOPIC - Abnormal; Notable for the following components:   Color, Urine AMBER (*)    APPearance CLOUDY (*)    Specific Gravity, Urine 1.042 (*)    Hgb urine dipstick SMALL (*)    Bilirubin Urine SMALL (*)    Protein, ur 100 (*)    Leukocytes,Ua MODERATE (*)    RBC / HPF >50 (*)    WBC, UA >50 (*)    All other components within normal limits  SARS CORONAVIRUS 2 BY RT PCR (HOSPITAL ORDER, PERFORMED IN [facility])  LACTIC ACID, PLASMA  POC URINE PREG, ED  POC SARS CORONAVIRUS 2 AG -  ED  POCT PREGNANCY, URINE  POC SARS CORONAVIRUS 2 AG   ____________________________________________  EKG  ED ECG REPORT I, Jene Every, the attending physician, personally viewed and interpreted this ECG.  Date: 04/08/2020  Rhythm: Sinus tachycardia QRS Axis: normal Intervals: normal ST/T Wave abnormalities: normal Narrative Interpretation: no evidence of acute ischemia  ____________________________________________  RADIOLOGY  Chest x-ray reviewed by me, possible infiltrate left lower lung ____________________________________________   PROCEDURES  Procedure(s) performed: No  Procedures   Critical Care performed:  No ____________________________________________   INITIAL IMPRESSION / ASSESSMENT AND PLAN / ED COURSE  Pertinent labs & imaging results that were available during my care of the patient were reviewed by me and considered in my medical decision making (see chart for details).  Patient presents with fever, tachycardia, cough, mild shortness of breath.    Differential includes bacterial pneumonia, Covid pneumonia, other viral upper respiratory infection  Lab work is notable for mildly elevated white blood cell count, mildly elevated lactic acid.  Treated with IV fluids, IV Toradol with significant improvement in symptoms  Chest x-ray overall reassuring.  However given fever tachycardia,  cough will send for CT chest  Covid swab negative  CT chest demonstrates right lower lobe pneumonia consistent with CAP.  We will give dose of Rocephin here, very low risk for cross-reactivity given penicillin allergy.  Will discharged home with azithromycin and third-generation cephalosporin, strict return precautions discussed.    ____________________________________________   FINAL CLINICAL IMPRESSION(S) / ED DIAGNOSES  Final diagnoses:  Community acquired pneumonia of right lower lobe of lung        Note:  This document was prepared using Conservation officer, historic buildings and may include unintentional dictation errors.   Jene Every, MD 04/08/20 2239

## 2020-04-08 NOTE — ED Triage Notes (Addendum)
Pt arrived via POV with c/o fever and slight cough that started on July 28th. Pt also reports shortness of breath as well. No known COVID exposure.   Pt reports taking Tylenol around 630pm.

## 2020-04-11 ENCOUNTER — Encounter: Payer: Self-pay | Admitting: Internal Medicine

## 2020-04-11 ENCOUNTER — Ambulatory Visit (INDEPENDENT_AMBULATORY_CARE_PROVIDER_SITE_OTHER): Payer: BC Managed Care – PPO | Admitting: Internal Medicine

## 2020-04-11 DIAGNOSIS — R0600 Dyspnea, unspecified: Secondary | ICD-10-CM | POA: Diagnosis not present

## 2020-04-11 DIAGNOSIS — R059 Cough, unspecified: Secondary | ICD-10-CM

## 2020-04-11 DIAGNOSIS — J189 Pneumonia, unspecified organism: Secondary | ICD-10-CM | POA: Diagnosis not present

## 2020-04-11 DIAGNOSIS — R05 Cough: Secondary | ICD-10-CM

## 2020-04-11 MED ORDER — ALBUTEROL SULFATE HFA 108 (90 BASE) MCG/ACT IN AERS: 2.0000 | INHALATION_SPRAY | Freq: Four times a day (QID) | RESPIRATORY_TRACT | 0 refills | Status: DC | PRN

## 2020-04-11 NOTE — Progress Notes (Signed)
Name: Rose Mcmillan   MRN: [id number]    DOB: 07/01/1971   Date:04/11/2020       Progress Note  Subjective  Chief Complaint  Chief Complaint  Patient presents with  . Pneumonia    ED 8/1 confirmed pneumonia, feels improved from saturday and sunday, breathing is still difficult    I connected with  Rose Mcmillan on 04/11/20 at  1:40 PM EDT by telephone and verified that I am speaking with the correct person using two identifiers.  I discussed the limitations, risks, security and privacy concerns of performing an evaluation and management service by telephone and the availability of in person appointments. The patient expressed understanding and agreed to proceed. Staff also discussed with the patient that there may be a patient responsible charge related to this service. Patient Location: Home Provider Location: [facility] Additional Individuals present: none  HPI  Patient is a 49 year old female patient of Rose Mcmillan Last visit with Rose Mcmillan was 02/21/2020 with prediabetes noted and significant weight gain in the past 2 to 3 years (from 175 and April 2018 to 207 pounds in June 2021) She was seen in the emergency department 04/08/2020, diagnosed with pneumonia. Follows up today by phone visit.  The assessment/plan from that emergency department visit is as follows: INITIAL IMPRESSION / ASSESSMENT AND PLAN / ED COURSE Patient presents with fever, tachycardia, cough, mild shortness of breath.   Differential includes bacterial pneumonia, Covid pneumonia, other viral upper respiratory infection  Lab work is notable for mildly elevated white blood cell count, mildly elevated lactic acid.  Treated with IV fluids, IV Toradol with significant improvement in symptoms  Chest x-ray overall reassuring.  However given fever tachycardia, cough will send for CT chest  Covid swab negative  CT chest demonstrates right lower lobe pneumonia consistent with CAP.  We will give dose of  Rocephin here, very low risk for cross-reactivity given penicillin allergy.  Will discharge home with azithromycin and third-generation cephalosporin, strict return precautions discussed.  She noted today on phone conversation that she feels overall improved, although symptoms not resolved.  Still noting some shortness of breath, some coughing in between, noted sleeps good last couple nights. Not taking anything for the cough. Not taking an inhaler Not wheezing, can hear a little wheezing at night at times. No N/V, no fevers, temp highest since home is 99 Is resting No loss of taste or smell.  Appetite had been down, today was hungry.   Had vaccine.  All in all, thinks is moving in right direction  Asked if contagious, informed is.  Has note to be off until the 4th.   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    Patient Active Problem List   Diagnosis Date Noted  . Allergic urticaria 03/21/2019  . Angioedema 03/21/2019  . Pruritus 03/21/2019  . Pre-diabetes 09/23/2018  . Dysmenorrhea 06/30/2018  . Class 2 obesity with body mass index (BMI) of 35.0 to 35.9 in adult 05/19/2016    Past Surgical History:  Procedure Laterality Date  . CESAREAN SECTION     x2  . CHOLECYSTECTOMY      Family History  Problem Relation Age of Onset  . Colon cancer Maternal Grandmother 89  . Breast cancer Other        70 s  . Breast cancer Cousin 50  . Ovarian cancer Neg Hx     Social History   Tobacco Use  . Smoking status: Never Smoker  .  Smokeless tobacco: Never Used  Substance Use Topics  . Alcohol use: No     Current Outpatient Medications:  .  azithromycin (ZITHROMAX Z-PAK) 250 MG tablet, Take 2 tablets (500 mg) on  Day 1,  followed by 1 tablet (250 mg) once daily on Days 2 through 5., Disp: 6 each, Rfl: 0 .  cefpodoxime (VANTIN) 200 MG tablet, Take 1 tablet (200 mg total) by mouth 2 (two) times daily., Disp: 14 tablet, Rfl: 0 .  cetirizine HCl (ZYRTEC) 5 MG/5ML SOLN,  Take 10 mg by mouth daily. , Disp: , Rfl:  .  levocetirizine (XYZAL) 2.5 MG/5ML solution, Take 10 mLs (5 mg total) by mouth 2 (two) times daily as needed for allergies., Disp: 600 mL, Rfl: 5  Allergies  Allergen Reactions  . Penicillins Nausea And Vomiting    With staff assistance, above reviewed with the patient today.  ROS: As per HPI, otherwise no specific complaints on a limited and focused system review   Objective  Virtual encounter, vitals not obtained.  There is no height or weight on file to calculate BMI.  Physical Exam   Appears in NAD via conversation Breathing: No obvious respiratory distress. Speaking in complete sentences, does not answer questions more deliberately, with a mild intermittent cough noted, had no prolonged coughing attacks. Neurological: Pt is alert and oriented, Speech is normal Psychiatric: Patient has a normal mood and affect, Judgment and thought content normal.   Results for orders placed or performed during the hospital encounter of 04/08/20 (from the past 72 hour(s))  Lactic acid, plasma     Status: Abnormal   Collection Time: 04/08/20  7:34 PM  Result Value Ref Range   Lactic Acid, Venous 2.2 (HH) 0.5 - 1.9 mmol/L    Comment: CRITICAL RESULT CALLED TO, READ BACK BY AND VERIFIED WITH DAWN TULLOCH @2002  ON 04/08/20 SKL Performed at [facility], [address] [place], [place]   Comprehensive metabolic panel     Status: Abnormal   Collection Time: 04/08/20  7:34 PM  Result Value Ref Range   Sodium 132 (L) 135 - 145 mmol/L   Potassium 3.2 (L) 3.5 - 5.1 mmol/L   Chloride 98 98 - 111 mmol/L   CO2 22 22 - 32 mmol/L   Glucose, Bld 165 (H) 70 - 99 mg/dL    Comment: Glucose reference range applies only to samples taken after fasting for at least 8 hours.   BUN 11 6 - 20 mg/dL   Creatinine, Ser 06/08/20 0.44 - 1.00 mg/dL   Calcium 8.7 (L) 8.9 - 10.3 mg/dL   Total Protein 8.4 (H) 6.5 - 8.1 g/dL   Albumin 3.7 3.5 - 5.0 g/dL    AST 70 (H) 15 - 41 U/L   ALT 44 0 - 44 U/L   Alkaline Phosphatase 109 38 - 126 U/L   Total Bilirubin 1.0 0.3 - 1.2 mg/dL   GFR calc non Af Amer >60 >60 mL/min   GFR calc Af Amer >60 >60 mL/min   Anion gap 12 5 - 15    Comment: Performed at [facility], [address] [place], [place]  CBC with Differential     Status: Abnormal   Collection Time: 04/08/20  7:34 PM  Result Value Ref Range   WBC 11.0 (H) 4.0 - 10.5 K/uL   RBC 4.55 3.87 - 5.11 MIL/uL   Hemoglobin 11.8 (L) 12.0 - 15.0 g/dL   HCT 06/08/20 (L) 36 - 46 %  MCV 77.6 (L) 80.0 - 100.0 fL   MCH 25.9 (L) 26.0 - 34.0 pg   MCHC 33.4 30.0 - 36.0 g/dL   RDW 16.1 09.6 - 04.5 %   Platelets 366 150 - 400 K/uL   nRBC 0.0 0.0 - 0.2 %   Neutrophils Relative % 76 %   Neutro Abs 8.3 (H) 1.7 - 7.7 K/uL   Lymphocytes Relative 17 %   Lymphs Abs 1.8 0.7 - 4.0 K/uL   Monocytes Relative 5 %   Monocytes Absolute 0.6 0 - 1 K/uL   Eosinophils Relative 1 %   Eosinophils Absolute 0.1 0 - 0 K/uL   Basophils Relative 1 %   Basophils Absolute 0.1 0 - 0 K/uL   Immature Granulocytes 0 %   Abs Immature Granulocytes 0.04 0.00 - 0.07 K/uL    Comment: Performed at [facility], [address] [place], [place] [address]  Urinalysis, Complete w Microscopic     Status: Abnormal   Collection Time: 04/08/20  7:34 PM  Result Value Ref Range   Color, Urine AMBER (A) YELLOW    Comment: BIOCHEMICALS MAY BE AFFECTED BY COLOR   APPearance CLOUDY (A) CLEAR   Specific Gravity, Urine 1.042 (H) 1.005 - 1.030   pH 5.0 5.0 - 8.0   Glucose, UA NEGATIVE NEGATIVE mg/dL   Hgb urine dipstick SMALL (A) NEGATIVE   Bilirubin Urine SMALL (A) NEGATIVE   Ketones, ur NEGATIVE NEGATIVE mg/dL   Protein, ur 191 (A) NEGATIVE mg/dL   Nitrite NEGATIVE NEGATIVE   Leukocytes,Ua MODERATE (A) NEGATIVE   RBC / HPF >50 (H) 0 - 5 RBC/hpf   WBC, UA >50 (H) 0 - 5 WBC/hpf   Bacteria, UA NONE SEEN NONE SEEN   Squamous Epithelial / LPF 21-50 0 - 5   Mucus  PRESENT     Comment: Performed at [facility], [address] [place], [place] [address]  SARS Coronavirus 2 by RT PCR (hospital order, performed in [facility] hospital lab) Nasopharyngeal Nasopharyngeal Swab     Status: None   Collection Time: 04/08/20  8:49 PM   Specimen: Nasopharyngeal Swab  Result Value Ref Range   SARS Coronavirus 2 NEGATIVE NEGATIVE    Comment: (NOTE) SARS-CoV-2 target nucleic acids are NOT DETECTED.  The SARS-CoV-2 RNA is generally detectable in upper and lower respiratory specimens during the acute phase of infection. The lowest concentration of SARS-CoV-2 viral copies this assay can detect is 250 copies / mL. A negative result does not preclude SARS-CoV-2 infection and should not be used as the sole basis for treatment or other patient management decisions.  A negative result may occur with improper specimen collection / handling, submission of specimen other than nasopharyngeal swab, presence of viral mutation(s) within the areas targeted by this assay, and inadequate number of viral copies (<250 copies / mL). A negative result must be combined with clinical observations, patient history, and epidemiological information.  Fact Sheet for Patients:   BoilerBrush.com.cy  Fact Sheet for Healthcare Providers: https://pope.com/  This test is not yet approved or  cleared by the [place] FDA and has been authorized for detection and/or diagnosis of SARS-CoV-2 by FDA under an Emergency Use Authorization (EUA).  This EUA will remain in effect (meaning this test can be used) for the duration of the COVID-19 declaration under Section 564(b)(1) of the Act, 21 U.S.C. section 360bbb-3(b)(1), unless the authorization is terminated or revoked sooner.  Performed at [facility], [address]  [place], [place] [address]   Pregnancy, urine POC     Status: None   Collection Time: 04/08/20  8:59  PM  Result Value Ref Range   Preg Test, Ur NEGATIVE NEGATIVE    Comment:        THE SENSITIVITY OF THIS METHODOLOGY IS >24 mIU/mL   POC SARS Coronavirus 2 Ag     Status: None   Collection Time: 04/08/20  9:29 PM  Result Value Ref Range   SARS Coronavirus 2 Ag NEGATIVE NEGATIVE    Comment: (NOTE) SARS-CoV-2 antigen NOT DETECTED.   Negative results are presumptive.  Negative results do not preclude SARS-CoV-2 infection and should not be used as the sole basis for treatment or other patient management decisions, including infection  control decisions, particularly in the presence of clinical signs and  symptoms consistent with COVID-19, or in those who have been in contact with the virus.  Negative results must be combined with clinical observations, patient history, and epidemiological information. The expected result is Negative.  Fact Sheet for Patients: https://sanders-williams.net/  Fact Sheet for Healthcare Providers: https://martinez.com/   This test is not yet approved or cleared by the [place] FDA and  has been authorized for detection and/or diagnosis of SARS-CoV-2 by FDA under an Emergency Use Authorization (EUA).  This EUA will remain in effect (meaning this test can be used) for the duration of  the C OVID-19 declaration under Section 564(b)(1) of the Act, 21 U.S.C. section 360bbb-3(b)(1), unless the authorization is terminated or revoked sooner.    Lactic acid, plasma     Status: None   Collection Time: 04/08/20 10:35 PM  Result Value Ref Range   Lactic Acid, Venous 1.1 0.5 - 1.9 mmol/L    Comment: Performed at [facility], [address] [place], [place] [address]    PHQ2/9: Depression screen [facility] 2/9 04/11/2020 02/21/2020 04/26/2019 12/07/2018  Decreased Interest 0 0 1 0  Down, Depressed, Hopeless 0 0 0 0  PHQ - 2 Score 0 0 1 0  Altered sleeping 0 0 1 0  Tired, decreased energy 0 0 3 0  Change in appetite 0 0  0 0  Feeling bad or failure about yourself  0 0 0 0  Trouble concentrating 0 0 2 0  Moving slowly or fidgety/restless 0 0 0 0  Suicidal thoughts 0 0 0 0  PHQ-9 Score 0 0 7 0  Difficult doing work/chores Not difficult at all Not difficult at all Somewhat difficult Not difficult at all   PHQ-2/9 Result reviewed  Fall Risk: Fall Risk  04/11/2020 02/21/2020 04/26/2019 12/07/2018  Falls in the past year? 0 0 0 0  Number falls in past yr: 0 0 0 0  Injury with Fall? 0 0 0 0  Follow up - Falls evaluation completed - Falls evaluation completed     Assessment & Plan  1. Pneumonia of right lower lobe due to infectious organism 2. Cough/mild wheeze 3. Dyspnea, unspecified type All in all, she does feel like she is improved, and noted that is encouraging. Needs to continue the antibiotic regimen. We will add an albuterol inhaler-take routinely 3-4 times a day for the next several days until symptoms resolving, as do think that will be helpful with her symptoms as well as for clearance. Also recommended an expectorant type product like a Robitussin or Mucinex type product to help as needed. Continue with rest, and do feel it is too early to go back to work tomorrow.  Will extend the the time off until next Monday, and will asked Melissa to help get her that note. If not feeling better and well enough to return to work then, she should call and follow-up to reassess. Emphasized importance of covering her cough, and precautions at home with her being potentially contagious, and she was understanding of that.  Her Covid test was negative in the emergency room and she did have the vaccine, and seems not a Covid concern presently.  Did note the potential for false negatives, and if symptoms not continuing to improve or worsening again, the importance of following up more acutely in the emergent setting again to be reassessed  - albuterol (VENTOLIN HFA) 108 (90 Base) MCG/ACT inhaler; Inhale 2 puffs into the  lungs every 6 (six) hours as needed for wheezing or shortness of breath.  Dispense: 6.7 g; Refill: 0  I discussed the assessment and treatment plan with the patient. The patient was provided an opportunity to ask questions and all were answered. The patient agreed with the plan and demonstrated an understanding of the instructions.  Red flags and when to present for emergency care or RTC including fevers, shortness of breath, new/worsening/un-resolving symptoms reviewed with patient at time of visit.   The patient was advised to call back or seek an in-person evaluation if the symptoms worsen or if the condition fails to improve as anticipated.  I provided 20 minutes of non-face-to-face time during this encounter that included discussing at length patient's sx/history, pertinent pmhx, medications, treatment and follow up plan. This time also included the necessary documentation, orders, and chart review.  Jamelle Haring, MD

## 2020-04-16 ENCOUNTER — Telehealth: Payer: Self-pay

## 2020-04-16 NOTE — Telephone Encounter (Signed)
Had pneumonia dx'ed 8/1. On f/u 8/4 was a little better noted.  She needs to finish the Vantin course and continue symptomatic measures. If she is not improving, or symptoms more problematic, especially if more SOB and any heart racing/chest pains present, she needs to be seen in an ER setting. Also likely should have a repeat Covid test.  [facility]

## 2020-04-16 NOTE — Telephone Encounter (Signed)
Returned the patient's call. She is still experiencing shortness of breath(very hard to walk up her stairs) coughing and painful to yawn or sneeze. Patient denies fever. She is using her inhaler when she feels necessary and says that helps but she feels her heart rate increase rapidly.  She does feel she has some improvement from last weekend but her shortness of breath and cough are the most bothersome.   Copied from CRM [id number]. Topic: General - Other >> Apr 16, 2020 10:03 AM Wyonia Hough E wrote: Reason for CRM: Pt asked to speak with Aryel Edelen/ Pt was advised to call today and let Dr. Dorris Fetch know ow she is feeling/ please advise

## 2020-04-16 NOTE — Telephone Encounter (Signed)
Patient states she has finished her Zpak but is still taking her Vantin 200mg  BID

## 2020-04-16 NOTE — Telephone Encounter (Signed)
Ok for the work note as requested. Thanks.

## 2020-04-16 NOTE — Telephone Encounter (Signed)
I spoke with the patient and she advised she needs another work note excusing her until next Monday April 23, 2020. Patient states if she can return sooner she will. She will continue her Vantin. Patient will report to ED if symptoms persist or worsen- temperature increase, racing heart, chest pains, shortness of breath.

## 2020-04-23 NOTE — Telephone Encounter (Signed)
Spoke with patient. She requested a f/u visit to see if she needs repeat imaging for her pneumonia. Patient scheduled for April 25, 2020 @ 3:00p.

## 2020-04-23 NOTE — Telephone Encounter (Signed)
Pt called stating that she does feel better. She states that she does still a little bit of cough, and some shortness of breath. 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 She is requesting a call back. Please advise.

## 2020-04-23 NOTE — Telephone Encounter (Signed)
Returned patient's call. She states she still feels the cough lingering, she has some shortness of breath with a lot of movement. Patient wants to know if she has work restrictions? 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 She also wants to know if she can have another CT scan to make sure her lungs have recovered?

## 2020-04-23 NOTE — Telephone Encounter (Signed)
If she can work from home for a week, that would be reasonable if still has some lingering symptoms as noted.  If her sx's are increasing again, she needs follow -up visit to be assessed. Would need a follow-up in person assessment before any repeat CXR or CT would be considered.  [facility]

## 2020-04-24 NOTE — Progress Notes (Signed)
Patient ID: Rose Mcmillan, female    DOB: 1971-04-17, 49 y.o.   MRN: [id number]  PCP: Danelle Berry, PA-C  Chief Complaint  Patient presents with  . Follow-up  . Pneumonia    cough is lingering  . Cough    Subjective:   Rose Mcmillan is a 50 y.o. female, presents to clinic with CC of the following:  Chief Complaint  Patient presents with  . Follow-up  . Pneumonia    cough is lingering  . Cough    HPI:  Patient is a 49 year old patient of Danelle Berry After recent visit with me on 04/11/2020 by telephone for follow-up for pneumonia, this message was sent:  Returned patient's call. She states she still feels the cough lingering, she has some shortness of breath with a lot of movement. Patient wants to know if she has work restrictions? 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 She also wants to know if she can have another CT scan to make sure her lungs have recovered?  My response was as follows:  If she can work from home for a week, that would be reasonable if still has some lingering symptoms as noted.  If her sx's are increasing again, she needs follow -up visit to be assessed. Would need a follow-up in person assessment before any repeat CXR or CT would be considered.  [facility] She requested a follow-up visit to reassess, and presents today for follow-up.   To review, she was seen on 04/08/2020 in the emergency room setting, with the following noted:  Lab work is notable for mildly elevated white blood cell count, mildly elevated lactic acid. Treated with IV fluids, IV Toradol with significant improvement in symptoms  Chest x-ray overall reassuring. However given fever tachycardia, cough will send for CT chest  Covid swab negative  CT chest demonstrates right lower lobe pneumonia consistent with CAP.  We will give dose of Rocephin here, very low risk for cross-reactivity given penicillin allergy. Will discharge home with azithromycin  and third-generation cephalosporin, strict return precautions discussed.   At that 04/11/2020 visit, she noted she was feeling better, and the following was recommended:  Needs to continue the antibiotic regimen. We will add an albuterol inhaler-take routinely 3-4 times a day for the next several days until symptoms resolving, as do think that will be helpful with her symptoms as well as for clearance. Also recommended an expectorant type product like a Robitussin or Mucinex type product to help as needed. Continue with rest, and do feel it is too early to go back to work tomorrow.  Will extend the the time off until next Monday, and will asked Melissa to help get her that note. If not feeling better and well enough to return to work then, she should call and follow-up to reassess. Emphasized importance of covering her cough, and precautions at home with her being potentially contagious, and she was understanding of that.  Her Covid test was negative in the emergency room and she did have the vaccine, and seems not a Covid concern presently.  Did note the potential for false negatives, and if symptoms not continuing to improve or worsening again, the importance of following up more acutely in the emergent setting again to be reassessed  Since our last telephone visit, she notes she has felt much better, although continues to have a little dry hacking cough that is annoying.  Most concerning is her  fatigue and mild shortness of breath with trying to do any type of exertion.  She noted walking into the office, walking up steps she feels her heart rate increased and feels more fatigued, with it taking a lot of effort to try to be more active.  She denies any chest pains.  Denies any shortness of breath at rest, no orthopnea or PND.  No fevers, no postnasal drip.  No bad myalgias like a flulike syndrome, no loss of taste or smell. She has no asthma history.  She has been using the albuterol inhaler  intermittently, yesterday used it twice, both times after going up the steps.  Also still using a Robitussin product. She has stayed home this week and works from home, and starts school on Monday, and requested a note to be off the rest of this week. She has no history of blood clots personally, no family history of blood clots, and she did have a CT scan done initially when first seen to ensure there was not this concern.  Has had no increased calf pains or swellings.  She did note all in all she definitely feels better when first presented.  Patient Active Problem List   Diagnosis Date Noted  . Pneumonia of right lower lobe due to infectious organism 04/11/2020  . Allergic urticaria 03/21/2019  . Angioedema 03/21/2019  . Pruritus 03/21/2019  . Pre-diabetes 09/23/2018  . Dysmenorrhea 06/30/2018  . Class 2 obesity with body mass index (BMI) of 35.0 to 35.9 in adult 05/19/2016      Current Outpatient Medications:  .  albuterol (VENTOLIN HFA) 108 (90 Base) MCG/ACT inhaler, Inhale 2 puffs into the lungs every 6 (six) hours as needed for wheezing or shortness of breath., Disp: 6.7 g, Rfl: 0 .  cetirizine HCl (ZYRTEC) 5 MG/5ML SOLN, Take 10 mg by mouth daily. , Disp: , Rfl:  .  levocetirizine (XYZAL) 2.5 MG/5ML solution, Take 10 mLs (5 mg total) by mouth 2 (two) times daily as needed for allergies., Disp: 600 mL, Rfl: 5   Allergies  Allergen Reactions  . Penicillins Nausea And Vomiting     Past Surgical History:  Procedure Laterality Date  . CESAREAN SECTION     x2  . CHOLECYSTECTOMY       Family History  Problem Relation Age of Onset  . Colon cancer Maternal Grandmother 31  . Breast cancer Other        70s  . Breast cancer Cousin 50  . Ovarian cancer Neg Hx      Social History   Tobacco Use  . Smoking status: Never Smoker  . Smokeless tobacco: Never Used  Substance Use Topics  . Alcohol use: No    With staff assistance, above reviewed with the patient  today.  ROS: As per HPI, otherwise no specific complaints on a limited and focused system review   No results found for this or any previous visit (from the past 72 hour(s)).   PHQ2/9: Depression screen [facility] 2/9 04/25/2020 04/11/2020 02/21/2020 04/26/2019 12/07/2018  Decreased Interest 0 0 0 1 0  Down, Depressed, Hopeless 1 0 0 0 0  PHQ - 2 Score 1 0 0 1 0  Altered sleeping 3 0 0 1 0  Tired, decreased energy 3 0 0 3 0  Change in appetite 3 0 0 0 0  Feeling bad or failure about yourself  0 0 0 0 0  Trouble concentrating 0 0 0 2 0  Moving slowly or fidgety/restless 0  0 0 0 0  Suicidal thoughts 0 0 0 0 0  PHQ-9 Score 10 0 0 7 0  Difficult doing work/chores Not difficult at all Not difficult at all Not difficult at all Somewhat difficult Not difficult at all   PHQ-2/9 Result reviewed, somewhat feeling down related to her symptoms above  Fall Risk: Fall Risk  04/25/2020 04/11/2020 02/21/2020 04/26/2019 12/07/2018  Falls in the past year? 0 0 0 0 0  Number falls in past yr: 0 0 0 0 0  Injury with Fall? 0 0 0 0 0  Follow up - - Falls evaluation completed - Falls evaluation completed      Objective:   Vitals:   04/25/20 1458 04/25/20 1501  BP: 112/74   Pulse: (!) 131 (!) 106  Resp: 16   Temp: 98.8 F (37.1 C)   TempSrc: Oral   SpO2: 99%   Weight: 199 lb 4.8 oz (90.4 kg)   Height: 5\' 4"  (1.626 m)     Body mass index is 34.21 kg/m.  Physical Exam   NAD, masked, very pleasant, not ill-appearing HEENT - [place]/AT, sclera anicteric, PERRL, EOMI, conj - non-inj'ed, nares patent, no marked erythema, no focal sinus tenderness,, pharynx clear Neck - supple, no adenopathy, no rigidity Car - RRR with borderline tachycardia with her heart rate for me approximately 92 and regular, was slightly higher on first check by Melissa, without m/g/r Pulm- RR and effort normal at rest, CTA without wheeze or rales, good O2 sat noted Abd - soft, NT diffusely,  Back - no CVA tenderness Ext - no LE  edema, Neuro/psychiatric - affect was not flat, appropriate with conversation  Alert   Grossly non-focal   Speech  normal   Results for orders placed or performed during the hospital encounter of 04/08/20  SARS Coronavirus 2 by RT PCR (hospital order, performed in [facility] lab) Nasopharyngeal Nasopharyngeal Swab   Specimen: Nasopharyngeal Swab  Result Value Ref Range   SARS Coronavirus 2 NEGATIVE NEGATIVE  Lactic acid, plasma  Result Value Ref Range   Lactic Acid, Venous 2.2 (HH) 0.5 - 1.9 mmol/L  Lactic acid, plasma  Result Value Ref Range   Lactic Acid, Venous 1.1 0.5 - 1.9 mmol/L  Comprehensive metabolic panel  Result Value Ref Range   Sodium 132 (L) 135 - 145 mmol/L   Potassium 3.2 (L) 3.5 - 5.1 mmol/L   Chloride 98 98 - 111 mmol/L   CO2 22 22 - 32 mmol/L   Glucose, Bld 165 (H) 70 - 99 mg/dL   BUN 11 6 - 20 mg/dL   Creatinine, Ser 1.611.00 0.44 - 1.00 mg/dL   Calcium 8.7 (L) 8.9 - 10.3 mg/dL   Total Protein 8.4 (H) 6.5 - 8.1 g/dL   Albumin 3.7 3.5 - 5.0 g/dL   AST 70 (H) 15 - 41 U/L   ALT 44 0 - 44 U/L   Alkaline Phosphatase 109 38 - 126 U/L   Total Bilirubin 1.0 0.3 - 1.2 mg/dL   GFR calc non Af Amer >60 >60 mL/min   GFR calc Af Amer >60 >60 mL/min   Anion gap 12 5 - 15  CBC with Differential  Result Value Ref Range   WBC 11.0 (H) 4.0 - 10.5 K/uL   RBC 4.55 3.87 - 5.11 MIL/uL   Hemoglobin 11.8 (L) 12.0 - 15.0 g/dL   HCT 09.635.3 (L) 36 - 46 %   MCV 77.6 (L) 80.0 - 100.0 fL   MCH 25.9 (  L) 26.0 - 34.0 pg   MCHC 33.4 30.0 - 36.0 g/dL   RDW 36.6 44.0 - 34.7 %   Platelets 366 150 - 400 K/uL   nRBC 0.0 0.0 - 0.2 %   Neutrophils Relative % 76 %   Neutro Abs 8.3 (H) 1.7 - 7.7 K/uL   Lymphocytes Relative 17 %   Lymphs Abs 1.8 0.7 - 4.0 K/uL   Monocytes Relative 5 %   Monocytes Absolute 0.6 0 - 1 K/uL   Eosinophils Relative 1 %   Eosinophils Absolute 0.1 0 - 0 K/uL   Basophils Relative 1 %   Basophils Absolute 0.1 0 - 0 K/uL   Immature Granulocytes 0 %   Abs  Immature Granulocytes 0.04 0.00 - 0.07 K/uL  Urinalysis, Complete w Microscopic  Result Value Ref Range   Color, Urine AMBER (A) YELLOW   APPearance CLOUDY (A) CLEAR   Specific Gravity, Urine 1.042 (H) 1.005 - 1.030   pH 5.0 5.0 - 8.0   Glucose, UA NEGATIVE NEGATIVE mg/dL   Hgb urine dipstick SMALL (A) NEGATIVE   Bilirubin Urine SMALL (A) NEGATIVE   Ketones, ur NEGATIVE NEGATIVE mg/dL   Protein, ur 425 (A) NEGATIVE mg/dL   Nitrite NEGATIVE NEGATIVE   Leukocytes,Ua MODERATE (A) NEGATIVE   RBC / HPF >50 (H) 0 - 5 RBC/hpf   WBC, UA >50 (H) 0 - 5 WBC/hpf   Bacteria, UA NONE SEEN NONE SEEN   Squamous Epithelial / LPF 21-50 0 - 5   Mucus PRESENT   Pregnancy, urine POC  Result Value Ref Range   Preg Test, Ur NEGATIVE NEGATIVE  POC SARS Coronavirus 2 Ag  Result Value Ref Range   SARS Coronavirus 2 Ag NEGATIVE NEGATIVE   Prior labs reviewed.    Assessment & Plan:   1. Pneumonia of right lower lobe due to infectious organism 2. Cough Encouraged that she definitely feels better, although still struggling to return to her baseline with persistence of this dry hacking cough at times, fatigue, and symptoms of increased heart rate and mild shortness of breath with minimal exertion presently.  Noted that this could be from being slightly deconditioned with the recent pneumonia, do feel she definitely has a component of postinfectious asthma with inflammation the source of some of her respiratory symptoms.  Encourage lungs sound clear on exam today.  Doubt a pulmonary embolus concern presently, and did just have the recent CT noted.  Also discussed the potential of Covid in this pandemic, with the initial test negative.  She has not had further symptoms of concern with no myalgias, loss of taste or smell, although noted still potentially in the differential with that first negative test early.  May want to repeat that test again pending her clinical status over time.  Agreed to get a chest x-ray  presently to help further assess and ensure there are not further concerns. Also will check a CBC We will add an inhaled steroid to help presently, with a fluticasone product recommended to use twice daily. Can still use the albuterol inhaler, and would use morning and night regularly for a few days, to help with clearance, can still use as needed during the daytime hours in addition, and if symptoms improving over the next few days, would return to just as needed use. Can still use an expectorant as needed. Did extend the note for this week, with plans to return on Monday, although if more symptomatic will be following up.  - DG  Chest 2 View; Future - CBC with Differential/Platelet - fluticasone (FLOVENT HFA) 110 MCG/ACT inhaler; Inhale 2 puffs into the lungs 2 (two) times daily. Rinse out mouth and spit after each use  Dispense: 1 each; Refill: 1  3. Mild intermittent asthma without complication As above, do feel she definitely has a postinfectious asthma concern, with the management as above. Educated her on this today. - DG Chest 2 View; Future - fluticasone (FLOVENT HFA) 110 MCG/ACT inhaler; Inhale 2 puffs into the lungs 2 (two) times daily. Rinse out mouth and spit after each use  Dispense: 1 each; Refill: 1  4. Other fatigue She was mildly anemic on that last blood counts checked, and will recheck the CBC today. Likely related to the recent pneumonia and recovering from that. Continue to monitor presently. Also recommended staying well-hydrated in these warmer weather days, and as improving, slowly increasing exertional activities over time is best. - CBC with Differential/Platelet - BASIC METABOLIC PANEL WITH GFR  5. Hypokalemia Her electrolytes were slightly off on that last check, and will recheck a BMP today.  - BASIC METABOLIC PANEL WITH GFR  6. Anemia, unspecified type As above, recheck a CBC today.  - CBC with Differential/Platelet    Await lab results presently,  also the repeat chest x-ray, and she is aware and will have those results likely back until tomorrow, and also assess response with the steroid inhaler addition and a little more frequent use of the albuterol inhaler in the short-term.  Note provided as above. Emphasized following up with symptoms not improving or more problematic.    Jamelle Haring, MD 04/25/20 3:03 PM

## 2020-04-25 ENCOUNTER — Ambulatory Visit
Admission: RE | Admit: 2020-04-25 | Discharge: 2020-04-25 | Disposition: A | Discharge status: Home / Self Care | Payer: BC Managed Care – PPO | Source: Ambulatory Visit | Attending: Internal Medicine | Admitting: Internal Medicine

## 2020-04-25 ENCOUNTER — Ambulatory Visit
Admission: RE | Admit: 2020-04-25 | Discharge: 2020-04-25 | Disposition: A | Discharge status: Home / Self Care | Payer: BC Managed Care – PPO | Attending: Internal Medicine | Admitting: Internal Medicine

## 2020-04-25 ENCOUNTER — Encounter: Payer: Self-pay | Admitting: Internal Medicine

## 2020-04-25 ENCOUNTER — Ambulatory Visit (INDEPENDENT_AMBULATORY_CARE_PROVIDER_SITE_OTHER): Payer: BC Managed Care – PPO | Admitting: Internal Medicine

## 2020-04-25 ENCOUNTER — Other Ambulatory Visit: Payer: Self-pay

## 2020-04-25 VITALS — BP 112/74 | HR 106 | Temp 98.8°F | Resp 16 | Ht 64.0 in | Wt 199.3 lb

## 2020-04-25 DIAGNOSIS — D649 Anemia, unspecified: Secondary | ICD-10-CM | POA: Diagnosis not present

## 2020-04-25 DIAGNOSIS — J189 Pneumonia, unspecified organism: Secondary | ICD-10-CM

## 2020-04-25 DIAGNOSIS — R05 Cough: Secondary | ICD-10-CM | POA: Diagnosis not present

## 2020-04-25 DIAGNOSIS — E876 Hypokalemia: Secondary | ICD-10-CM

## 2020-04-25 DIAGNOSIS — R059 Cough, unspecified: Secondary | ICD-10-CM

## 2020-04-25 DIAGNOSIS — J4520 Mild intermittent asthma, uncomplicated: Secondary | ICD-10-CM | POA: Diagnosis not present

## 2020-04-25 DIAGNOSIS — R5383 Other fatigue: Secondary | ICD-10-CM | POA: Diagnosis not present

## 2020-04-25 DIAGNOSIS — J9811 Atelectasis: Secondary | ICD-10-CM | POA: Diagnosis not present

## 2020-04-25 LAB — CBC WITH DIFFERENTIAL/PLATELET
Absolute Monocytes: 420 cells/uL (ref 200–950)
Basophils Absolute: 60 cells/uL (ref 0–200)
Basophils Relative: 1 %
Eosinophils Absolute: 120 cells/uL (ref 15–500)
Eosinophils Relative: 2 %
HCT: 36.1 % (ref 35.0–45.0)
Hemoglobin: 11.9 g/dL (ref 11.7–15.5)
Lymphs Abs: 2868 cells/uL (ref 850–3900)
MCH: 26 pg — ABNORMAL LOW (ref 27.0–33.0)
MCHC: 33 g/dL (ref 32.0–36.0)
MCV: 79 fL — ABNORMAL LOW (ref 80.0–100.0)
MPV: 9.6 fL (ref 7.5–12.5)
Monocytes Relative: 7 %
Neutro Abs: 2532 cells/uL (ref 1500–7800)
Neutrophils Relative %: 42.2 %
Platelets: 512 10*3/uL — ABNORMAL HIGH (ref 140–400)
RBC: 4.57 10*6/uL (ref 3.80–5.10)
RDW: 14.5 % (ref 11.0–15.0)
Total Lymphocyte: 47.8 %
WBC: 6 10*3/uL (ref 3.8–10.8)

## 2020-04-25 LAB — BASIC METABOLIC PANEL WITH GFR
BUN/Creatinine Ratio: 6 (calc) (ref 6–22)
BUN: 5 mg/dL — ABNORMAL LOW (ref 7–25)
CO2: 26 mmol/L (ref 20–32)
Calcium: 9.3 mg/dL (ref 8.6–10.2)
Chloride: 102 mmol/L (ref 98–110)
Creat: 0.8 mg/dL (ref 0.50–1.10)
GFR, Est African American: 100 mL/min/{1.73_m2} (ref >=60)
GFR, Est Non African American: 87 mL/min/{1.73_m2} (ref >=60)
Glucose, Bld: 105 mg/dL — ABNORMAL HIGH (ref 65–99)
Potassium: 4.2 mmol/L (ref 3.5–5.3)
Sodium: 137 mmol/L (ref 135–146)

## 2020-04-25 MED ORDER — FLUTICASONE PROPIONATE HFA 110 MCG/ACT IN AERO: 2.0000 | INHALATION_SPRAY | Freq: Two times a day (BID) | RESPIRATORY_TRACT | 1 refills | Status: DC

## 2020-04-25 NOTE — Patient Instructions (Signed)
Please get the chest x-ray ordered this afternoon  Await the lab tests ordered and the result of the chest x-ray which will not be back until tomorrow likely.  Also, please pick up the steroid inhaler at the pharmacy recommended to help, use twice daily  Can still use the albuterol inhaler, recommend using morning and night for a few days regularly, and during the day as needed, and then as improving, return to just as needed use.

## 2020-07-24 ENCOUNTER — Other Ambulatory Visit: Payer: Self-pay | Admitting: Allergy and Immunology

## 2020-08-20 ENCOUNTER — Ambulatory Visit: Payer: BC Managed Care – PPO | Admitting: Family Medicine

## 2020-08-23 ENCOUNTER — Ambulatory Visit: Payer: BC Managed Care – PPO | Admitting: Family Medicine

## 2020-10-09 ENCOUNTER — Other Ambulatory Visit: Payer: Self-pay | Admitting: Obstetrics and Gynecology

## 2020-10-09 DIAGNOSIS — Z1231 Encounter for screening mammogram for malignant neoplasm of breast: Secondary | ICD-10-CM

## 2020-10-12 ENCOUNTER — Encounter: Payer: Self-pay | Admitting: Family Medicine

## 2020-10-12 ENCOUNTER — Other Ambulatory Visit: Payer: Self-pay

## 2020-10-12 ENCOUNTER — Telehealth: Payer: BC Managed Care – PPO | Admitting: Family Medicine

## 2020-10-12 VITALS — BP 124/72 | HR 96 | Temp 99.4°F | Resp 16 | Ht 64.0 in | Wt 210.0 lb

## 2020-10-12 DIAGNOSIS — E669 Obesity, unspecified: Secondary | ICD-10-CM | POA: Diagnosis not present

## 2020-10-12 DIAGNOSIS — L500 Allergic urticaria: Secondary | ICD-10-CM | POA: Diagnosis not present

## 2020-10-12 DIAGNOSIS — D649 Anemia, unspecified: Secondary | ICD-10-CM | POA: Diagnosis not present

## 2020-10-12 DIAGNOSIS — N946 Dysmenorrhea, unspecified: Secondary | ICD-10-CM | POA: Diagnosis not present

## 2020-10-12 DIAGNOSIS — R5383 Other fatigue: Secondary | ICD-10-CM | POA: Diagnosis not present

## 2020-10-12 DIAGNOSIS — R7303 Prediabetes: Secondary | ICD-10-CM

## 2020-10-12 DIAGNOSIS — Z6835 Body mass index (BMI) 35.0-35.9, adult: Secondary | ICD-10-CM

## 2020-10-12 DIAGNOSIS — L299 Pruritus, unspecified: Secondary | ICD-10-CM

## 2020-10-12 DIAGNOSIS — Z5181 Encounter for therapeutic drug level monitoring: Secondary | ICD-10-CM

## 2020-10-12 MED ORDER — LEVOCETIRIZINE DIHYDROCHLORIDE 2.5 MG/5ML PO SOLN: 5.0000 mg | Freq: Two times a day (BID) | ORAL | 5 refills | Status: DC | PRN

## 2020-10-12 NOTE — Progress Notes (Signed)
Name: Rose Mcmillan   MRN: [id number]    DOB: 01-17-1971   Date:10/12/2020       Progress Note  Subjective:    I connected with  Rose Mcmillan  on 10/12/20 at  3:40 PM EST by a video enabled telemedicine application and verified that I am speaking with the correct person using two identifiers.  I discussed the limitations of evaluation and management by telemedicine and the availability of in person appointments. The patient expressed understanding and agreed to proceed. Staff also discussed with the patient that there may be a patient responsible charge related to this service. Patient Location: home Provider Location: [facility] Additional Individuals present: none  Chief Complaint  Patient presents with  . Consult    Want labs     Rose Mcmillan is a 50 y.o. female, presents for virtual visit for routine follow up on the conditions listed above.  F/up labs-  Hx of obesity: Wt Readings from Last 5 Encounters:  04/25/20 199 lb 4.8 oz (90.4 kg)  04/08/20 202 lb (91.6 kg)  02/21/20 207 lb 9.6 oz (94.2 kg)  10/26/19 204 lb (92.5 kg)  03/21/19 200 lb 9.6 oz (91 kg)   BMI Readings from Last 5 Encounters:  04/25/20 34.21 kg/m  04/08/20 34.67 kg/m  02/21/20 35.63 kg/m  10/26/19 35.02 kg/m  03/21/19 34.43 kg/m    Reviewed Dr. Rexene Edison last OV, note, labs and result note and f/up plan - pt new to me Plan was to f/up on anemia, get iron panel Some mildly elevated blood sugar  Pt appt was for f/up on labs  Only on meds for allergies/hives Had pneumonia and hx of asthma - rescue inhaler on med list and flovent maintenance inhaler  Pt came to get labs this after noon  She reports her breathing is back to her baseline, she denies any hx of asthma, she has not used albuterol inhaler since recovering   Dysmenorrhea - heavy bleeding for the first 2 days, with heavy back and stomach cramping, then symptoms usually lighten up, regular cycle    Weight up a little  -  Wt Readings from Last 5 Encounters:  10/12/20 210 lb (95.3 kg)  04/25/20 199 lb 4.8 oz (90.4 kg)  04/08/20 202 lb (91.6 kg)  02/21/20 207 lb 9.6 oz (94.2 kg)  10/26/19 204 lb (92.5 kg)   BMI Readings from Last 5 Encounters:  10/12/20 36.05 kg/m  04/25/20 34.21 kg/m  04/08/20 34.67 kg/m  02/21/20 35.63 kg/m  10/26/19 35.02 kg/m   OBGYN she has her well     Patient Active Problem List   Diagnosis Date Noted  . Allergic urticaria 03/21/2019  . Pruritus 03/21/2019  . Pre-diabetes 09/23/2018  . Dysmenorrhea 06/30/2018  . Class 2 obesity with body mass index (BMI) of 35.0 to 35.9 in adult 05/19/2016    Current Outpatient Medications:  .  cetirizine HCl (ZYRTEC) 5 MG/5ML SOLN, Take 10 mg by mouth daily. , Disp: , Rfl:  .  levocetirizine (XYZAL) 2.5 MG/5ML solution, Take 10 mLs (5 mg total) by mouth 2 (two) times daily as needed for allergies (chronic idiopathic hives and pruritis)., Disp: 600 mL, Rfl: 5 Allergies  Allergen Reactions  . Penicillins Nausea And Vomiting    Past Surgical History:  Procedure Laterality Date  . CESAREAN SECTION     x2  . CHOLECYSTECTOMY     Family History  Problem Relation Age of Onset  . Colon cancer  Maternal Grandmother 15  . Breast cancer Other        70s  . Breast cancer Cousin 50  . Ovarian cancer Neg Hx    Social History   Socioeconomic History  . Marital status: Married    Spouse name: robin  . Number of children: 2  . Years of education: Not on file  . Highest education level: Not on file  Occupational History  . Not on file  Tobacco Use  . Smoking status: Never Smoker  . Smokeless tobacco: Never Used  Vaping Use  . Vaping Use: Never used  Substance and Sexual Activity  . Alcohol use: No  . Drug use: No  . Sexual activity: Yes    Birth control/protection: Surgical    Comment: tubal ligation  Other Topics Concern  . Not on file  Social History Narrative  . Not on file   Social Determinants of Health    Financial Resource Strain: Not on file  Food Insecurity: Not on file  Transportation Needs: Not on file  Physical Activity: Not on file  Stress: Not on file  Social Connections: Not on file  Intimate Partner Violence: Not on file    Chart Review Today: I personally reviewed active problem list, medication list, allergies, family history, social history, health maintenance, notes from last encounter, lab results, imaging with the patient/caregiver today.   Review of Systems  Constitutional: Negative.   HENT: Negative.   Eyes: Negative.   Respiratory: Negative.   Cardiovascular: Negative.   Gastrointestinal: Negative.   Endocrine: Negative.   Genitourinary: Negative.   Musculoskeletal: Negative.   Skin: Negative.   Allergic/Immunologic: Negative.   Neurological: Negative.   Hematological: Negative.   Psychiatric/Behavioral: Negative.   All other systems reviewed and are negative.     Objective:    Virtual encounter, vitals limited, only able to obtain the following Today's Vitals   10/12/20 1514  BP: 124/72  Pulse: 96  Resp: 16  Temp: 99.4 F (37.4 C)  TempSrc: Oral  SpO2: 99%  Weight: 210 lb (95.3 kg)  Height: 5\' 4"  (1.626 m)   Body mass index is 36.05 kg/m. Nursing Note and Vital Signs reviewed.  Physical Exam Vitals and nursing note reviewed.  Constitutional:      Appearance: She is obese.  Cardiovascular:     Rate and Rhythm: Normal rate.  Pulmonary:     Effort: No respiratory distress.  Neurological:     Mental Status: She is alert.  Psychiatric:        Mood and Affect: Mood normal.        Behavior: Behavior normal.     PE limited by telephone encounter  No results found for this or any previous visit (from the past 72 hour(s)).  PHQ2/9: Depression screen [facility] 2/9 10/12/2020 04/25/2020 04/11/2020 02/21/2020 04/26/2019  Decreased Interest 0 0 0 0 1  Down, Depressed, Hopeless 0 1 0 0 0  PHQ - 2 Score 0 1 0 0 1  Altered sleeping - 3 0 0 1   Tired, decreased energy - 3 0 0 3  Change in appetite - 3 0 0 0  Feeling bad or failure about yourself  - 0 0 0 0  Trouble concentrating - 0 0 0 2  Moving slowly or fidgety/restless - 0 0 0 0  Suicidal thoughts - 0 0 0 0  PHQ-9 Score - 10 0 0 7  Difficult doing work/chores - Not difficult at all Not difficult at all Not  difficult at all Somewhat difficult   PHQ-2/9 Result is neg, reviewed, improved from prior  Fall Risk: Fall Risk  10/12/2020 04/25/2020 04/11/2020 02/21/2020 04/26/2019  Falls in the past year? 0 0 0 0 0  Number falls in past yr: 0 0 0 0 0  Injury with Fall? 0 0 0 0 0  Follow up Falls evaluation completed - - Falls evaluation completed -     Assessment and Plan:     ICD-10-CM   1. Allergic urticaria  L50.0 CBC with Differential/Platelet   Chronic and recurrent for many years was unable to get refill of Xyzal needs to take antihistamines twice a day has symptoms if she stops meds  2. Dysmenorrhea  N94.6 CBC with Differential/Platelet    Iron, TIBC and Ferritin Panel   Pain and heavy bleeding for the first 2 days but normal duration of menses and normal cycle -she follows with [facility]  3. Pre-diabetes  R73.03 COMPLETE METABOLIC PANEL WITH GFR    Hemoglobin A1c   Recheck with history of prediabetes -she will have her physical in the next month  4. Class 2 obesity with body mass index (BMI) of 35.0 to 35.9 in adult, unspecified obesity type, unspecified whether serious comorbidity present  E66.9 COMPLETE METABOLIC PANEL WITH GFR   Z68.35 Hemoglobin A1c   Weight up -monitoring  5. Anemia, unspecified type  D64.9 CBC with Differential/Platelet    Iron, TIBC and Ferritin Panel   improved with last labs but still microcytosis - f/up labs  6. Encounter for medication monitoring  Z51.81 CBC with Differential/Platelet    COMPLETE METABOLIC PANEL WITH GFR    Hemoglobin A1c    Iron, TIBC and Ferritin Panel  7. Pruritus  L29.9 levocetirizine (XYZAL) 2.5 MG/5ML solution    Past pneumonia she had pneumonia and some reactive airway about 6 months ago but she feels she has totally recovered.  She denies any history of asthma we will remove this from her problem list Vital signs reassuring today  I discussed the assessment and treatment plan with the patient. The patient was provided an opportunity to ask questions and all were answered. The patient agreed with the plan and demonstrated an understanding of the instructions.  The patient was advised to call back or seek an in-person evaluation if the symptoms worsen or if the condition fails to improve as anticipated.  I provided 30+ minutes of non-face-to-face time during this encounter.  Danelle Berry, PA-C 10/12/20 5:18 PM

## 2020-10-13 LAB — CBC WITH DIFFERENTIAL/PLATELET
Absolute Monocytes: 421 cells/uL (ref 200–950)
Basophils Absolute: 62 cells/uL (ref 0–200)
Basophils Relative: 0.9 %
Eosinophils Absolute: 207 cells/uL (ref 15–500)
Eosinophils Relative: 3 %
HCT: 32.8 % — ABNORMAL LOW (ref 35.0–45.0)
Hemoglobin: 10.7 g/dL — ABNORMAL LOW (ref 11.7–15.5)
Lymphs Abs: 3091 cells/uL (ref 850–3900)
MCH: 25.7 pg — ABNORMAL LOW (ref 27.0–33.0)
MCHC: 32.6 g/dL (ref 32.0–36.0)
MCV: 78.8 fL — ABNORMAL LOW (ref 80.0–100.0)
MPV: 9.8 fL (ref 7.5–12.5)
Monocytes Relative: 6.1 %
Neutro Abs: 3119 cells/uL (ref 1500–7800)
Neutrophils Relative %: 45.2 %
Platelets: 409 10*3/uL — ABNORMAL HIGH (ref 140–400)
RBC: 4.16 10*6/uL (ref 3.80–5.10)
RDW: 13.6 % (ref 11.0–15.0)
Total Lymphocyte: 44.8 %
WBC: 6.9 10*3/uL (ref 3.8–10.8)

## 2020-10-13 LAB — IRON,TIBC AND FERRITIN PANEL
%SAT: 12 % (calc) — ABNORMAL LOW (ref 16–45)
Ferritin: 16 ng/mL (ref 16–232)
Iron: 45 ug/dL (ref 40–190)
TIBC: 361 mcg/dL (calc) (ref 250–450)

## 2020-10-13 LAB — COMPLETE METABOLIC PANEL WITH GFR
AG Ratio: 1.3 (calc) (ref 1.0–2.5)
ALT: 8 U/L (ref 6–29)
AST: 11 U/L (ref 10–35)
Albumin: 3.9 g/dL (ref 3.6–5.1)
Alkaline phosphatase (APISO): 71 U/L (ref 31–125)
BUN: 10 mg/dL (ref 7–25)
CO2: 29 mmol/L (ref 20–32)
Calcium: 8.7 mg/dL (ref 8.6–10.2)
Chloride: 103 mmol/L (ref 98–110)
Creat: 0.73 mg/dL (ref 0.50–1.10)
GFR, Est African American: 112 mL/min/{1.73_m2} (ref >=60)
GFR, Est Non African American: 97 mL/min/{1.73_m2} (ref >=60)
Globulin: 3.1 g/dL (calc) (ref 1.9–3.7)
Glucose, Bld: 110 mg/dL — ABNORMAL HIGH (ref 65–99)
Potassium: 3.7 mmol/L (ref 3.5–5.3)
Sodium: 139 mmol/L (ref 135–146)
Total Bilirubin: 0.4 mg/dL (ref 0.2–1.2)
Total Protein: 7 g/dL (ref 6.1–8.1)

## 2020-10-13 LAB — HEMOGLOBIN A1C
Hgb A1c MFr Bld: 6.4 % of total Hgb — ABNORMAL HIGH (ref <5.7)
Mean Plasma Glucose: 137 mg/dL
eAG (mmol/L): 7.6 mmol/L

## 2020-10-15 ENCOUNTER — Encounter: Payer: Self-pay | Admitting: Family Medicine

## 2020-10-15 ENCOUNTER — Other Ambulatory Visit: Payer: Self-pay

## 2020-10-15 ENCOUNTER — Ambulatory Visit: Payer: BC Managed Care – PPO | Admitting: Family Medicine

## 2020-10-15 VITALS — BP 128/78 | HR 94 | Temp 98.9°F | Resp 16 | Ht 64.0 in | Wt 209.7 lb

## 2020-10-15 DIAGNOSIS — R7303 Prediabetes: Secondary | ICD-10-CM

## 2020-10-15 DIAGNOSIS — M546 Pain in thoracic spine: Secondary | ICD-10-CM | POA: Diagnosis not present

## 2020-10-15 DIAGNOSIS — E611 Iron deficiency: Secondary | ICD-10-CM | POA: Diagnosis not present

## 2020-10-15 DIAGNOSIS — D649 Anemia, unspecified: Secondary | ICD-10-CM | POA: Diagnosis not present

## 2020-10-15 MED ORDER — IRON 325 (65 FE) MG PO TABS: 1.0000 | ORAL_TABLET | ORAL | 1 refills | Status: DC

## 2020-10-15 MED ORDER — NAPROXEN SODIUM 220 MG PO TABS
220.0000 mg | ORAL_TABLET | Freq: Two times a day (BID) | ORAL | 2 refills | Status: DC | PRN
Start: 2020-10-15 — End: 2020-10-29

## 2020-10-15 NOTE — Progress Notes (Signed)
Name: Rose Mcmillan   MRN: [id number]    DOB: [birth year]   Date:10/15/2020       Progress Note  Chief Complaint  Patient presents with  . Back Pain    Onset 3 days     Subjective:   Rose Mcmillan is a 50 y.o. female, presents to clinic for back pain  Back Pain This is a new problem. The current episode started in the past 7 days. The problem occurs intermittently. The problem is unchanged. The pain is present in the thoracic spine. The pain does not radiate. The pain is moderate. The symptoms are aggravated by twisting, bending and position. Pertinent negatives include no abdominal pain, bladder incontinence, bowel incontinence, chest pain, dysuria, fever, headaches, leg pain, numbness, paresis, paresthesias, pelvic pain, perianal numbness, tingling, weakness or weight loss. Risk factors include obesity.   Anemia: Hemoglobin  Date Value Ref Range Status       04/25/2020 11.9 11.7 - 15.5 g/dL Final  28/76/8115 72.6 (L) 12.0 - 15.0 g/dL Final  20/35/5974 16.3 11.1 - 15.9 g/dL Final  84/53/6468 03.2 12.0 - 16.0 g/dL Final      Current Outpatient Medications:  .  Ferrous Sulfate (IRON) 325 (65 Fe) MG TABS, Take 1 tablet (325 mg total) by mouth 3 (three) times a week., Disp: 36 tablet, Rfl: 1 .  naproxen sodium (ALEVE) 220 MG tablet, Take 1 tablet (220 mg total) by mouth 2 (two) times daily as needed., Disp: 60 tablet, Rfl: 2 .  cetirizine HCl (ZYRTEC) 5 MG/5ML SOLN, Take 10 mg by mouth daily. , Disp: , Rfl:  .  levocetirizine (XYZAL) 2.5 MG/5ML solution, Take 10 mLs (5 mg total) by mouth 2 (two) times daily as needed for allergies (chronic idiopathic hives and pruritis)., Disp: 600 mL, Rfl: 5  Patient Active Problem List   Diagnosis Date Noted  . Allergic urticaria 03/21/2019  . Pruritus 03/21/2019  . Pre-diabetes 09/23/2018  . Dysmenorrhea 06/30/2018  . Class 2 obesity with body mass index (BMI) of 35.0 to 35.9 in adult 05/19/2016    Past Surgical History:   Procedure Laterality Date  . CESAREAN SECTION     x2  . CHOLECYSTECTOMY      Family History  Problem Relation Age of Onset  . Colon cancer Maternal Grandmother 52  . Breast cancer Other        70s  . Breast cancer Cousin 50  . Ovarian cancer Neg Hx     Social History   Tobacco Use  . Smoking status: Never Smoker  . Smokeless tobacco: Never Used  Vaping Use  . Vaping Use: Never used  Substance Use Topics  . Alcohol use: No  . Drug use: No     Allergies  Allergen Reactions  . Penicillins Nausea And Vomiting    Health Maintenance  Topic Date Due  . Hepatitis C Screening  Never done  . HIV Screening  Never done  . COLONOSCOPY (Pts 45-47yrs Insurance coverage will need to be confirmed)  Never done  . COVID-19 Vaccine (3 - Booster for Pfizer series) 07/04/2020  . INFLUENZA VACCINE  11/05/2020 (Originally 04/08/2020)  . PAP SMEAR-Modifier  06/30/2021  . TETANUS/TDAP  05/19/2026    Chart Review Today: I personally reviewed active problem list, medication list, allergies, family history, social history, health maintenance, notes from last encounter, lab results, imaging with the patient/caregiver today.   Review of Systems  Constitutional: Negative.  Negative for fever and weight loss.  HENT:  Negative.   Eyes: Negative.   Respiratory: Negative.   Cardiovascular: Negative.  Negative for chest pain.  Gastrointestinal: Negative.  Negative for abdominal pain and bowel incontinence.  Endocrine: Negative.   Genitourinary: Negative.  Negative for bladder incontinence, dysuria and pelvic pain.  Musculoskeletal: Positive for back pain.  Skin: Negative.   Allergic/Immunologic: Negative.   Neurological: Negative.  Negative for tingling, weakness, numbness, headaches and paresthesias.  Hematological: Negative.   Psychiatric/Behavioral: Negative.   All other systems reviewed and are negative.    Objective:   Vitals:   10/15/20 1426  BP: 128/78  Pulse: 94  Resp: 16   Temp: 98.9 F (37.2 C)  SpO2: 94%  Weight: 209 lb 11.2 oz (95.1 kg)  Height: 5\' 4"  (1.626 m)    Body mass index is 35.99 kg/m.  Physical Exam Vitals and nursing note reviewed.  Constitutional:      General: She is not in acute distress.    Appearance: Normal appearance. She is well-developed. She is not ill-appearing, toxic-appearing or diaphoretic.     Interventions: Face mask in place.  HENT:     Head: Normocephalic and atraumatic.     Right Ear: External ear normal.     Left Ear: External ear normal.  Eyes:     General: Lids are normal. No scleral icterus.       Right eye: No discharge.        Left eye: No discharge.     Conjunctiva/sclera: Conjunctivae normal.  Neck:     Trachea: Phonation normal. No tracheal deviation.  Cardiovascular:     Rate and Rhythm: Normal rate and regular rhythm.     Pulses: Normal pulses.          Radial pulses are 2+ on the right side and 2+ on the left side.       Posterior tibial pulses are 2+ on the right side and 2+ on the left side.     Heart sounds: Normal heart sounds. No murmur heard. No friction rub. No gallop.   Pulmonary:     Effort: Pulmonary effort is normal. No respiratory distress.     Breath sounds: Normal breath sounds. No stridor. No wheezing, rhonchi or rales.  Chest:     Chest wall: No tenderness.  Abdominal:     General: Bowel sounds are normal. There is no distension.     Palpations: Abdomen is soft.     Tenderness: There is no abdominal tenderness. There is no right CVA tenderness, left CVA tenderness, guarding or rebound.  Musculoskeletal:        General: No deformity.     Cervical back: Normal, normal range of motion and neck supple.     Thoracic back: Tenderness (thoracic paraspinal muscle ttp) present. No swelling, edema, deformity, signs of trauma, lacerations or bony tenderness. Normal range of motion.     Lumbar back: No tenderness or bony tenderness. Normal range of motion. Negative right straight leg  raise test and negative left straight leg raise test.     Right lower leg: No edema.     Left lower leg: No edema.     Comments: Normal gross sensation to light touch b/l to LE Normal strength and gait  Lymphadenopathy:     Cervical: No cervical adenopathy.  Skin:    General: Skin is warm and dry.     Coloration: Skin is not jaundiced or pale.     Findings: No rash.  Neurological:     Mental  Status: She is alert and oriented to person, place, and time.     Motor: No abnormal muscle tone.     Gait: Gait normal.  Psychiatric:        Speech: Speech normal.        Behavior: Behavior normal.         Assessment & Plan:     ICD-10-CM   1. Acute left-sided thoracic back pain  M54.6 Ambulatory referral to Physical Therapy   acute, manage conservatively, no red flags, PT referral Hand out and info given  With anemia avoid NSAIDs and ASA Tylenol or muscle relaxer    2. Pre-diabetes  R73.03     Lab Results  Component Value Date   HGBA1C 6.4 (H) 10/12/2020     3. Anemia, unspecified type  D64.9 Ferrous Sulfate (IRON) 325 (65 Fe) MG TABS   mild anemia, labs previously ordered - slight worsening of H/H See below    4. Iron deficiency  E61.1 Ferrous Sulfate (IRON) 325 (65 Fe) MG TABS   Stool cards given to pt - to r/o GI blood loss - start iron po if stool cards negative Instructions to pt:   Do the stool cards and return to clinic, and then start an oral iron supplement three days a week - take on an empty stomach, drink AMPLE clear fluids and stool softeners if needed.     Return in about 3 months (around 01/12/2021) for f/up with PT and f/up in clinic if not improving in 2-4 weeks, 3 month anemia A1C OV f/up.   Danelle Berry, PA-C 10/15/20 2:58 PM

## 2020-10-15 NOTE — Patient Instructions (Addendum)
Hemoglobin  Date Value Ref Range Status  10/12/2020 10.7 (L) 11.7 - 15.5 g/dL Final  82/02/155 15.3 11.7 - 15.5 g/dL Final  79/43/2761 47.0 (L) 12.0 - 15.0 g/dL Final  92/95/7473 40.3 11.1 - 15.9 g/dL Final  70/96/4383 81.8 12.0 - 16.0 g/dL Final   Lab Results  Component Value Date   IRON 45 10/12/2020   TIBC 361 10/12/2020   FERRITIN 16 10/12/2020   Lab Results  Component Value Date   HGBA1C 6.4 (H) 10/12/2020    Do the stool cards and return to clinic, and then start an oral iron supplement three days a week - take on an empty stomach, drink AMPLE clear fluids and stool softeners if needed.

## 2020-10-22 ENCOUNTER — Ambulatory Visit: Payer: BC Managed Care – PPO | Attending: Family Medicine | Admitting: Physical Therapy

## 2020-10-22 ENCOUNTER — Other Ambulatory Visit: Payer: Self-pay

## 2020-10-22 DIAGNOSIS — M546 Pain in thoracic spine: Secondary | ICD-10-CM | POA: Diagnosis not present

## 2020-10-22 NOTE — Therapy (Signed)
[facility] PHYS[facility] AND SPOR[facility] MEDICINE [address] [place], [place], [address] Phone: [phone]   Fax:  [phone]  Physical Therapy Evaluation  Patient Details  Name: Tamsen RoersLetonya Renee Sylve MRN: [id number] Date of Birth: 10/30/1970 Referring Provider (PT): Danelle Berryapia, Leisa, PA-C   Encounter Date: 10/22/2020   PT End of Session - 10/22/20 1930    Visit Number 1    Number of Visits 24    Date for PT Re-Evaluation 01/14/21    Authorization Type [employer] reporting period from 10/22/2020    Authorization - Visit Number 1    Progress Note Due on Visit 10    PT Start Time 1820    PT Stop Time 1900    PT Time Calculation (min) 40 min    Activity Tolerance Patient tolerated treatment well    Behavior [facility] for tasks assessed/performed           Past Medical History:  Diagnosis Date  . Allergic urticaria   . Angioedema 03/21/2019  . Pre-diabetes     Past Surgical History:  Procedure Laterality Date  . CESAREAN SECTION     x2  . CHOLECYSTECTOMY      There were no vitals filed for this visit.    Subjective Assessment - 10/22/20 1830    Subjective Patient states condition started Saturday 10/13/2020. She jumped out of bed and threw back the covers on the R side of the bed and she thinks that is what caused the pain. She started having pain that same day (maybe 2 hours later). She could notice something and then it gradually got worse throughout the day. Pain felt like it was under her left scapula and Saturday, Sunday, and Monday she also had pain under left breast but is has gone away.  She now just feels tightness in the area now. It has been getting better and continues to get better. No history of similar problems. Denies history of spinal problems.    Pertinent History Patient is a 50 y.o. female who presents to outpatient physical therapy with a referral for medical diagnosis Acute left-sided thoracic back pain. This  patient's chief complaints consist of acute left sided thoracic spine pain with history of radiation below left breast leading to the following functional deficits: reaching, getting in and out of bed; previously caused pain with deep breath, sneezing, yawning, etc.  Relevant past medical history and comorbidities include pre-diabetes, cholecystectomy, allergies.  Patient denies hx of cancer, stroke, seizures, lung problem, major cardiac events, diabetes, unexplained weight loss, changes in bowel or bladder problems, new onset stumbling or dropping things, spinal surgery.    Limitations Other (comment);House hold activities   reaching, getting in and out of bed, and used to cause pain with deep breath, sneezing, yawning, etc.   Patient Stated Goals "get this tension in my back to go away"    Currently in Pain? Yes    Pain Score 2    W: 10/10; B: 0/10   Pain Location Thoracic    Pain Orientation Left    Pain Descriptors / Indicators Tightness    Pain Type Acute pain    Pain Radiating Towards denies numbness/tingling. Did radiate towards L rib anterioly.    Pain Onset 1 to 4 weeks ago    Aggravating Factors  Agg: deep breath, sneezing, yawning, getting out of the bed to go to the bathroom at night, rolling over in bed.    Pain Relieving Factors  Ease: time, heating pad.    Effect of Pain on Daily Activities Functional Limitations: reaching, getting in and out of bed, and used to cause pain with deep breath, sneezing, yawning, etc.              [facility] PT Assessment - 10/22/20 1834      Assessment   Medical Diagnosis Acute left-sided thoracic back pain    Referring Provider (PT) Danelle Berry, PA-C    Onset Date/Surgical Date 10/13/20    Prior Therapy none for this problem prior to current episode of care      Balance Screen   Has the patient fallen in the past 6 months Yes    How many times? 1    Has the patient had a decrease in activity level because of a fear of falling?  No    Is the  patient reluctant to leave their home because of a fear of falling?  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 Seems symetrical by end of session.   . Tremor: none . Muscle bulk: appears WFL . Bed mobility: prone <> sit WFL . Transfers: sit <> stand WFL . Gait: grossly WFL for household and short community ambulation. More detailed gait analysis deferred to later date as needed.  . Stairs  SPINE MOTION Cervical Spine AROM *Indicates pain No reproduction of pain all directions with overpressure Thoracic  AROM *Indicates pain - Flexion: = no reproduction of symptoms with AROM - Extension: =  Mild reproduction of symptoms AROM - Rotation: R = 45, L = 55 (overpressure causes reproduction of sympotms with right rotation, not with left). - Side Flexion: No reproduction of symptoms with AROM either direction.  Lumbar AROM *Indicates pain - Flexion: = WNL, mild rib hump left side in mid-thoracic spine.  PERIPHERAL JOINT MOTION (in degrees) Active Range of Motion (AROM) Comments: B UE WFL with a little concordant soreness with L functional IR.    MUSCLE PERFORMANCE (MMT):  Comments: B UE 5/5 no reproduction of symptoms  REPEATED MOTIONS TESTING: Motion/Technique sets x reps During After  Thoracic extension over back of chair 1x10 End range pain Better (pain, L rotation ROM, sensation of tightness).     ACCESSORY MOTION:  - CPA approx T6 mild reproduction of concordant symptoms - UPA to L costotransverse joints/rib springing along ribs 5-6 more strongly reproduce concordant symptoms (no symptoms with springing under axilla).   PALPATION: - Mildly TTP long left rhomboid region.   EDUCATION/COGNITION: Patient is alert and oriented X 4.  Objective measurements completed on examination: See above findings.   TREATMENT:  Therapeutic exercise: to centralize symptoms and improve ROM, strength, muscular endurance, and activity tolerance required for successful completion of functional activities.  - seated thoracic extension over the back of a  chair (airex in chair), hands interlaced behind neck and elbows low under chin, 2x10 - seated thoracic extension with back against plinth (airex under buttocks but needs more lift), hands interlaced behind neck and elbows low under chin, 1x5  - seated AROM thoracic extension with hands interlaced behind neck and elbows low under chin x 10.  - Education on diagnosis, prognosis, POC, anatomy and physiology of current condition.   Pt required multimodal cuing for proper technique and to facilitate improved neuromuscular control, strength, range of motion, and functional ability resulting in improved performance and form.   HOME EXERCISE PROGRAM   [email or web address]  [id number] Home Exercise Program [[id number]]  Pick one to perform every 2 hours:   Thoracic Extension  -  Repeat 10 Times, Hold 3 Seconds, Complete 1 Set, Perform 6 Times a Day  Thoracic Extension  -  Repeat 10 Times, Hold 3 Seconds, Complete 1 Set, Perform 6 Times a Day  THORACIC EXTENSION OVER CHAIR -  Repeat 10 Times, Hold 3 Seconds, Complete 1 Set, Perform 6 Times a Day         PT Education - 10/22/20 1947    Education Details Exercise purpose/form. Self management techniques. Education on diagnosis, prognosis, POC, anatomy and physiology of current condition Education on HEP including  handout    Person(s) Educated Patient    Methods Explanation;Demonstration;Tactile cues;Verbal cues;Handout    Comprehension Tactile cues required;Verbal cues required;Returned demonstration;Verbalized understanding;Need further instruction            PT Short Term Goals - 10/22/20 1931      PT SHORT TERM GOAL #1   Title Be independent with initial home exercise program for self-management of symptoms.    Baseline initial HEP provided at IE (10/22/2020);    Time 2    Period Weeks    Status New    Target Date 11/05/20             PT Long Term Goals - 10/22/20 1933      PT LONG TERM GOAL #1   Title Be independent with a long-term home exercise program for self-management of symptoms.    Baseline Initial HEP provided at IE (10/22/2020);    Time 12    Period Weeks    Status New   TARGET DATE FOR ALL LONG TERM GOALS: 01/14/2021     PT LONG TERM GOAL #2   Title Demonstrate improved FOTO score by 10 units to demonstrate improvement in overall condition and self-reported functional ability.    Baseline to be measured visit 2 as appropriate (10/22/2020);    Time 12    Period Weeks    Status New      PT LONG TERM GOAL #3   Title Reduce pain with functional activities to equal or less than 1/10 to allow patient to complete usual activities including ADLs, IADLs, and social engagement with less difficulty.    Baseline 10/10 (10/22/2020);    Time 12    Period Weeks    Status New      PT LONG TERM GOAL #4   Title Have full thoracic spine AROM with no compensations or increase in pain in all planes except intermittent end range discomfort to allow patient to complete valued activities with less difficulty.    Baseline limited and painful - see objective exam (10/22/2020);    Time 12    Period Weeks    Status New      PT LONG TERM GOAL #5   Title  Complete community, work and/or recreational activities without limitation due to current condition.    Baseline Functional Limitations:  reaching, getting in and out of bed, and used to cause pain with deep breath, sneezing, yawning, etc (10/22/2020);    Time 12    Period Weeks    Status New                  Plan - 10/22/20 1944    Clinical Impression Statement Patient is a 50 y.o. female referred to outpatient physical therapy with a medical diagnosis of acute left-sided thoracic back pain who presents with signs and symptoms consistent with acute episode of left thoracic spine and rib pain at approximately T6 level with radiation along the rib at that level that is improving with time. Patient appears to have extension directional preference and initially good response to repeated extension exercises. Also has limitation in left rotation that may require repeated motion in this direction as well. Patient has a good prognosis at this point. Patient presents with significant pain, ROM, joint stiffness, muscle tension, muscle performance (power/strength/endurance), and activity tolerance impairments that are limiting ability to complete her usual activities including reaching, getting out of bed, respiration, etc. without difficulty. Patient will benefit from skilled physical therapy intervention to address current body structure impairments and activity limitations to improve function and work towards goals set in current POC in order to return to prior level of function or maximal functional improvement.    Personal Factors and Comorbidities Comorbidity 3+;Past/Current Experience;Fitness    Comorbidities Relevant past medical history and comorbidities include pre-diabetes, cholecystectomy, allergies.    Examination-Activity Limitations Bed Mobility    Examination-Participation Restrictions Other   Functional Limitations: reaching, getting in and out of bed, and used to cause pain with deep breath, sneezing, yawning, etc   Stability/Clinical Decision Making Stable/Uncomplicated    Clinical Decision Making Low    Rehab Potential  Excellent    PT Frequency 2x / week    PT Duration 12 weeks    PT Treatment/Interventions ADLs/Self Care Home Management;Cryotherapy;Moist Heat;Electrical Stimulation;Therapeutic activities;Therapeutic exercise;Neuromuscular re-education;Manual techniques;Dry needling;Passive range of motion;Spinal Manipulations;Joint Manipulations    PT Next Visit Plan updated HEP as appropriate, FOTO    PT Home Exercise Plan [email or web address]  [id number] Home Exercise Program ([id number])    Consulted and Agree with Plan of Care Patient           Patient will benefit from skilled therapeutic intervention in order to improve the following deficits and impairments:  Pain,Postural dysfunction,Increased muscle spasms,Decreased activity tolerance,Decreased endurance,Decreased range of motion,Decreased strength,Hypomobility,Impaired perceived functional ability,Impaired flexibility  Visit Diagnosis: Pain in thoracic spine     Problem List Patient Active Problem List   Diagnosis Date Noted  . Allergic urticaria 03/21/2019  . Pruritus 03/21/2019  . Pre-diabetes 09/23/2018  . Dysmenorrhea 06/30/2018  . Class 2 obesity with body mass index (BMI) of 35.0 to 35.9 in adult 05/19/2016    Huntley Dec R. Ilsa Iha, PT, DPT 10/22/20, 7:48 PM  [place] [facility] PHYSICAL AND SPORTS MEDICINE [address] [place], [address] Phone: [phone]   Fax:  [phone]  Name: Jannah Guardiola MRN: [id number] Date of Birth: 1970-11-09

## 2020-10-24 ENCOUNTER — Ambulatory Visit: Payer: BC Managed Care – PPO | Admitting: Physical Therapy

## 2020-10-29 ENCOUNTER — Encounter: Payer: Self-pay | Admitting: Physical Therapy

## 2020-10-29 ENCOUNTER — Encounter: Payer: Self-pay | Admitting: Obstetrics and Gynecology

## 2020-10-29 ENCOUNTER — Ambulatory Visit (INDEPENDENT_AMBULATORY_CARE_PROVIDER_SITE_OTHER): Payer: BC Managed Care – PPO | Admitting: Obstetrics and Gynecology

## 2020-10-29 ENCOUNTER — Other Ambulatory Visit: Payer: Self-pay

## 2020-10-29 VITALS — BP 124/80 | Ht 64.0 in | Wt 207.0 lb

## 2020-10-29 DIAGNOSIS — Z1231 Encounter for screening mammogram for malignant neoplasm of breast: Secondary | ICD-10-CM | POA: Diagnosis not present

## 2020-10-29 DIAGNOSIS — Z1211 Encounter for screening for malignant neoplasm of colon: Secondary | ICD-10-CM

## 2020-10-29 DIAGNOSIS — Z01419 Encounter for gynecological examination (general) (routine) without abnormal findings: Secondary | ICD-10-CM | POA: Diagnosis not present

## 2020-10-29 NOTE — Patient Instructions (Signed)
I value your feedback and you entrusting us with your care. If you get a [facility] patient survey, I would appreciate you taking the time to let us know about your experience today. Thank you! ? ? ?

## 2020-10-29 NOTE — Progress Notes (Signed)
PCP:  Danelle Berry, PA-C   Chief Complaint  Patient presents with  . Gynecologic Exam    No concerns     HPI:      [name] is a 50 y.o. [id number] who LMP was Patient's last menstrual period was 10/04/2020 (approximate)., presents today for her annual examination.  Her menses are regular every 28-30 days, lasting 5-7 days, mod flow.  Dysmenorrhea none. She does not have intermenstrual bleeding. Occas vasomotor sx.  Sex activity: single partner, contraception - tubal ligation.  Last Pap: 06/30/18 Results: no abnormalities/neg HPV DNA Hx of STDs: none  Last mammogram: 11/03/19 Results were normal, repeat in 12 months. Has appt 11/05/20. There is a FH of breast cancer in her mat grt aunt and cousin, genetic testing not indicated. There is no FH of ovarian cancer. The patient does do self-breast exams.  Tobacco use: The patient denies current or previous tobacco use. Alcohol use: none No drug use.  Exercise: not active  Colonoscopy: never  She does get adequate calcium and Vitamin D in her diet. Hx of pre-DM 2019 labs. Has PCP now   Past Medical History:  Diagnosis Date  . Allergic urticaria   . Angioedema 03/21/2019  . Pre-diabetes     Past Surgical History:  Procedure Laterality Date  . CESAREAN SECTION     x2  . CHOLECYSTECTOMY      Family History  Problem Relation Age of Onset  . Colon cancer Maternal Grandmother 90  . Breast cancer Other        70s  . Breast cancer Cousin 50  . Ovarian cancer Neg Hx     Social History   Socioeconomic History  . Marital status: Married    Spouse name: robin  . Number of children: 2  . Years of education: Not on file  . Highest education level: Not on file  Occupational History  . Not on file  Tobacco Use  . Smoking status: Never Smoker  . Smokeless tobacco: Never Used  Vaping Use  . Vaping Use: Never used  Substance and Sexual Activity  . Alcohol use: No  . Drug use: No  . Sexual activity: Yes     Birth control/protection: Surgical    Comment: tubal ligation  Other Topics Concern  . Not on file  Social History Narrative  . Not on file   Social Determinants of Health   Financial Resource Strain: Not on file  Food Insecurity: Not on file  Transportation Needs: Not on file  Physical Activity: Not on file  Stress: Not on file  Social Connections: Not on file  Intimate Partner Violence: Not on file    Outpatient Medications Prior to Visit  Medication Sig Dispense Refill  . cetirizine HCl (ZYRTEC) 5 MG/5ML SOLN Take 10 mg by mouth daily.     . Ferrous Sulfate (IRON) 325 (65 Fe) MG TABS Take 1 tablet (325 mg total) by mouth 3 (three) times a week. 36 tablet 1  . levocetirizine (XYZAL) 2.5 MG/5ML solution Take 10 mLs (5 mg total) by mouth 2 (two) times daily as needed for allergies (chronic idiopathic hives and pruritis). 600 mL 5  . naproxen sodium (ALEVE) 220 MG tablet Take 1 tablet (220 mg total) by mouth 2 (two) times daily as needed. 60 tablet 2   No facility-administered medications prior to visit.    ROS:  Review of Systems  Constitutional: Negative for fatigue, fever and unexpected weight change.  Respiratory: Negative for  cough, shortness of breath and wheezing.   Cardiovascular: Negative for chest pain, palpitations and leg swelling.  Gastrointestinal: Negative for blood in stool, constipation, diarrhea, nausea and vomiting.  Endocrine: Negative for cold intolerance, heat intolerance and polyuria.  Genitourinary: Negative for dyspareunia, dysuria, flank pain, frequency, genital sores, hematuria, menstrual problem, pelvic pain, urgency, vaginal bleeding, vaginal discharge and vaginal pain.  Musculoskeletal: Negative for back pain, joint swelling and myalgias.  Skin: Negative for rash.  Neurological: Negative for dizziness, syncope, light-headedness, numbness and headaches.  Hematological: Negative for adenopathy.  Psychiatric/Behavioral: Negative for agitation,  confusion, sleep disturbance and suicidal ideas. The patient is not nervous/anxious.   BREAST: No symptoms   Objective: BP 124/80   Ht 5\' 4"  (1.626 m)   Wt 207 lb (93.9 kg)   LMP 10/04/2020 (Approximate)   BMI 35.53 kg/m    Physical Exam Constitutional:      Appearance: She is well-developed.  Genitourinary:     Vulva normal.     Right Labia: No rash, tenderness or lesions.    Left Labia: No tenderness, lesions or rash.    No vaginal discharge, erythema or tenderness.      Right Adnexa: not tender and no mass present.    Left Adnexa: not tender and no mass present.    No cervical motion tenderness, friability or polyp.     Uterus is not enlarged or tender.  Breasts:     Right: No mass, nipple discharge, skin change or tenderness.     Left: No mass, nipple discharge, skin change or tenderness.    Neck:     Thyroid: No thyromegaly.  Cardiovascular:     Rate and Rhythm: Normal rate and regular rhythm.     Heart sounds: Normal heart sounds. No murmur heard.   Pulmonary:     Effort: Pulmonary effort is normal.     Breath sounds: Normal breath sounds.  Abdominal:     Palpations: Abdomen is soft.     Tenderness: There is no abdominal tenderness. There is no guarding or rebound.  Musculoskeletal:        General: Normal range of motion.     Cervical back: Normal range of motion.  Lymphadenopathy:     Cervical: No cervical adenopathy.  Neurological:     General: No focal deficit present.     Mental Status: She is alert and oriented to person, place, and time.     Cranial Nerves: No cranial nerve deficit.  Skin:    General: Skin is warm and dry.  Psychiatric:        Mood and Affect: Mood normal.        Behavior: Behavior normal.        Thought Content: Thought content normal.        Judgment: Judgment normal.  Vitals reviewed.     Assessment/Plan: Encounter for annual routine gynecological examination  Encounter for screening mammogram for malignant neoplasm of  breast; pt has appt  Screening for colon cancer - Plan: Ambulatory referral to Gastroenterology; refer to GI for screening colonoscopy due to age          GYN counsel breast self exam, mammography screening, adequate intake of calcium and vitamin D, diet and exercise     F/U  Return in about 1 year (around 10/29/2021).  Shireen Rayburn B. Glennette Galster, PA-C 10/29/2020 1:34 PM

## 2020-10-31 ENCOUNTER — Encounter: Payer: BC Managed Care – PPO | Admitting: Physical Therapy

## 2020-11-02 ENCOUNTER — Encounter: Payer: Self-pay | Admitting: Physical Therapy

## 2020-11-02 DIAGNOSIS — M546 Pain in thoracic spine: Secondary | ICD-10-CM

## 2020-11-02 NOTE — Therapy (Signed)
[facility] [address] [place], [address] Phone: [phone]   Fax:  [phone]  Physical Therapy No-Visit Discharge Summary Dates of reporting period: 10/22/2020 - 11/02/2020  Patient Details  Name: Rose Mcmillan MRN: [id number] Date of Birth: 02-Feb-1971 Referring Provider (PT): Delsa Grana, PA-C   Encounter Date: 11/02/2020    Past Medical History:  Diagnosis Date  . Allergic urticaria   . Angioedema 03/21/2019  . Pre-diabetes     Past Surgical History:  Procedure Laterality Date  . CESAREAN SECTION     x2  . CHOLECYSTECTOMY      There were no vitals filed for this visit.   Subjective Assessment - 11/02/20 0952    Subjective Patient called and spoke to office staff and requested discharge, stating she was thankful for what PT showed her at initial eval. No reason for cancellation given.    Pertinent History Patient is a 50 y.o. female who presents to outpatient physical therapy with a referral for medical diagnosis Acute left-sided thoracic back pain. This patient's chief complaints consist of acute left sided thoracic spine pain with history of radiation below left breast leading to the following functional deficits: reaching, getting in and out of bed; previously caused pain with deep breath, sneezing, yawning, etc.  Relevant past medical history and comorbidities include pre-diabetes, cholecystectomy, allergies.  Patient denies hx of cancer, stroke, seizures, lung problem, major cardiac events, diabetes, unexplained weight loss, changes in bowel or bladder problems, new onset stumbling or dropping things, spinal surgery.    Limitations Other (comment);House hold activities   reaching, getting in and out of bed, and used to cause pain with deep breath, sneezing, yawning, etc.   Patient Stated Goals "get this tension in my back to go away"           OBJECTIVE Patient is not present for  examination at this time. Please see previous documentation for latest objective data.    PT Short Term Goals - 11/02/20 3762      PT SHORT TERM GOAL #1   Title Be independent with initial home exercise program for self-management of symptoms.    Baseline initial HEP provided at IE (10/22/2020);    Time 2    Period Weeks    Status Achieved    Target Date 11/05/20             PT Long Term Goals - 11/02/20 0954      PT LONG TERM GOAL #1   Title Be independent with a long-term home exercise program for self-management of symptoms.    Baseline Initial HEP provided at IE (10/22/2020);    Time 12    Period Weeks    Status Partially Met   TARGET DATE FOR ALL LONG TERM GOALS: 01/14/2021     PT LONG TERM GOAL #2   Title Demonstrate improved FOTO score by 10 units to demonstrate improvement in overall condition and self-reported functional ability.    Baseline to be measured visit 2 as appropriate (10/22/2020);    Time 12    Period Weeks    Status Not Met      PT LONG TERM GOAL #3   Title Reduce pain with functional activities to equal or less than 1/10 to allow patient to complete usual activities including ADLs, IADLs, and social engagement with less difficulty.    Baseline 10/10 (10/22/2020);    Time 12    Period Weeks  Status Not Met      PT LONG TERM GOAL #4   Title Have full thoracic spine AROM with no compensations or increase in pain in all planes except intermittent end range discomfort to allow patient to complete valued activities with less difficulty.    Baseline limited and painful - see objective exam (10/22/2020);    Time 12    Period Weeks    Status Not Met      PT LONG TERM GOAL #5   Title Complete community, work and/or recreational activities without limitation due to current condition.    Baseline Functional Limitations: reaching, getting in and out of bed, and used to cause pain with deep breath, sneezing, yawning, etc (10/22/2020);    Time 12    Period Weeks     Status Not Met            Plan - 11/02/20 0957    Clinical Impression Statement Patient attended initial eval only, then called back later in the week to request discharge, thanking PT for exercises given but without stating reason for discharge. Patient had a Mcmillan response to treatment during her initial eval. Patient is now discharged from PT per her request.    Personal Factors and Comorbidities Comorbidity 3+;Past/Current Experience;Fitness    Comorbidities Relevant past medical history and comorbidities include pre-diabetes, cholecystectomy, allergies.    Examination-Activity Limitations Bed Mobility    Examination-Participation Restrictions Other   Functional Limitations: reaching, getting in and out of bed, and used to cause pain with deep breath, sneezing, yawning, etc   Stability/Clinical Decision Making Stable/Uncomplicated    Rehab Potential Excellent    PT Frequency 2x / week    PT Duration 12 weeks    PT Treatment/Interventions ADLs/Self Care Home Management;Cryotherapy;Moist Heat;Electrical Stimulation;Therapeutic activities;Therapeutic exercise;Neuromuscular re-education;Manual techniques;Dry needling;Passive range of motion;Spinal Manipulations;Joint Manipulations    PT Next Visit Plan Patient is now discharged from PT per her request    PT Home Exercise Plan [email or web address]  [id number] Home Exercise Program ([id number])    Consulted and Agree with Plan of Care Patient           Patient will benefit from skilled therapeutic intervention in order to improve the following deficits and impairments:  Pain,Postural dysfunction,Increased muscle spasms,Decreased activity tolerance,Decreased endurance,Decreased range of motion,Decreased strength,Hypomobility,Impaired perceived functional ability,Impaired flexibility  Visit Diagnosis: Pain in thoracic spine     Problem List Patient Active Problem List   Diagnosis Date Noted  . Allergic urticaria 03/21/2019  . Pruritus  03/21/2019  . Pre-diabetes 09/23/2018  . Dysmenorrhea 06/30/2018  . Class 2 obesity with body mass index (BMI) of 35.0 to 35.9 in adult 05/19/2016    Rose Mcmillan, PT, DPT 11/02/20, 9:58 AM  [facility] [address] [place], [address] Phone: [phone]   Fax:  [phone]  Name: Taylon Louison MRN: [id number] Date of Birth: [birth year]

## 2020-11-05 ENCOUNTER — Ambulatory Visit
Admission: RE | Admit: 2020-11-05 | Discharge: 2020-11-05 | Disposition: A | Discharge status: Home / Self Care | Payer: BC Managed Care – PPO | Source: Ambulatory Visit | Attending: Obstetrics and Gynecology | Admitting: Obstetrics and Gynecology

## 2020-11-05 ENCOUNTER — Other Ambulatory Visit: Payer: Self-pay

## 2020-11-05 DIAGNOSIS — Z1231 Encounter for screening mammogram for malignant neoplasm of breast: Secondary | ICD-10-CM | POA: Diagnosis not present

## 2020-11-07 ENCOUNTER — Other Ambulatory Visit: Payer: Self-pay | Admitting: Obstetrics and Gynecology

## 2020-11-07 ENCOUNTER — Encounter: Payer: BC Managed Care – PPO | Admitting: Physical Therapy

## 2020-11-07 DIAGNOSIS — N6489 Other specified disorders of breast: Secondary | ICD-10-CM

## 2020-11-07 DIAGNOSIS — R928 Other abnormal and inconclusive findings on diagnostic imaging of breast: Secondary | ICD-10-CM

## 2020-11-12 ENCOUNTER — Other Ambulatory Visit: Payer: Self-pay

## 2020-11-12 ENCOUNTER — Telehealth (INDEPENDENT_AMBULATORY_CARE_PROVIDER_SITE_OTHER): Payer: Self-pay | Admitting: Gastroenterology

## 2020-11-12 DIAGNOSIS — Z1211 Encounter for screening for malignant neoplasm of colon: Secondary | ICD-10-CM

## 2020-11-12 MED ORDER — NA SULFATE-K SULFATE-MG SULF 17.5-3.13-1.6 GM/177ML PO SOLN: 1.0000 | Freq: Once | ORAL | 0 refills | Status: AC

## 2020-11-12 NOTE — Progress Notes (Signed)
Gastroenterology Pre-Procedure Review  Request Date: Friday 12/14/20 Requesting Physician: Dr. Bonna Gains  PATIENT REVIEW QUESTIONS: The patient responded to the following health history questions as indicated:    1. Are you having any GI issues? no 2. Do you have a personal history of Polyps? no 3. Do you have a family history of Colon Cancer or Polyps? yes (grandmother colon cancer) 4. Diabetes Mellitus? no 5. Joint replacements in the past 12 months?no 6. Major health problems in the past 3 months?no 7. Any artificial heart valves, MVP, or defibrillator?no    MEDICATIONS & ALLERGIES:    Patient reports the following regarding taking any anticoagulation/antiplatelet therapy:   Plavix, Coumadin, Eliquis, Xarelto, Lovenox, Pradaxa, Brilinta, or Effient? no Aspirin? no  Patient confirms/reports the following medications:  Current Outpatient Medications  Medication Sig Dispense Refill  . Ferrous Sulfate (IRON) 325 (65 Fe) MG TABS Take 1 tablet (325 mg total) by mouth 3 (three) times a week. 36 tablet 1  . levocetirizine (XYZAL) 2.5 MG/5ML solution Take 10 mLs (5 mg total) by mouth 2 (two) times daily as needed for allergies (chronic idiopathic hives and pruritis). 600 mL 5  . Na Sulfate-K Sulfate-Mg Sulf 17.5-3.13-1.6 GM/177ML SOLN Take 1 kit by mouth once for 1 dose. 354 mL 0  . cetirizine HCl (ZYRTEC) 5 MG/5ML SOLN Take 10 mg by mouth daily.  (Patient not taking: Reported on 11/12/2020)     No current facility-administered medications for this visit.    Patient confirms/reports the following allergies:  Allergies  Allergen Reactions  . Penicillins Nausea And Vomiting    No orders of the defined types were placed in this encounter.   AUTHORIZATION INFORMATION Primary Insurance: 1D#: Group #:  Secondary Insurance: 1D#: Group #:  SCHEDULE INFORMATION: Date: 12/14/20 Time: Location:[facility]

## 2020-11-13 ENCOUNTER — Encounter: Payer: BC Managed Care – PPO | Admitting: Physical Therapy

## 2020-11-15 ENCOUNTER — Encounter: Payer: Self-pay | Admitting: Obstetrics and Gynecology

## 2020-11-15 ENCOUNTER — Telehealth: Payer: Self-pay

## 2020-11-15 NOTE — Telephone Encounter (Signed)
Pls change ref to [facility] for scr colonoscopy due to age with Dr. Norma Fredrickson and notify pt we're aware. Thank you.

## 2020-11-15 NOTE — Telephone Encounter (Signed)
Patient has asked that she have her colonoscopy procedure with Dr. Norma Fredrickson.  I explained to her that Dr. Norma Fredrickson is with [facility], and I would be happy to route a message to [employer] to ask her to send her colonoscopy referral to Dr. Norma Fredrickson at [facility].  Procedure has been canceled with Dr. Maximino Greenland 12/14/20.  Message has been routed to [employer].  I've asked patient to follow up as well.  Thanks,  [place], [place]

## 2020-11-20 ENCOUNTER — Ambulatory Visit: Admission: RE | Admit: 2020-11-20 | Payer: BC Managed Care – PPO | Source: Ambulatory Visit

## 2020-11-20 ENCOUNTER — Ambulatory Visit
Admission: RE | Admit: 2020-11-20 | Discharge: 2020-11-20 | Disposition: A | Discharge status: Home / Self Care | Payer: BC Managed Care – PPO | Source: Ambulatory Visit | Attending: Obstetrics and Gynecology | Admitting: Obstetrics and Gynecology

## 2020-11-20 ENCOUNTER — Encounter: Payer: BC Managed Care – PPO | Admitting: Physical Therapy

## 2020-11-20 ENCOUNTER — Other Ambulatory Visit: Payer: Self-pay

## 2020-11-20 DIAGNOSIS — R928 Other abnormal and inconclusive findings on diagnostic imaging of breast: Secondary | ICD-10-CM | POA: Insufficient documentation

## 2020-11-20 DIAGNOSIS — R922 Inconclusive mammogram: Secondary | ICD-10-CM | POA: Diagnosis not present

## 2020-11-20 DIAGNOSIS — N6489 Other specified disorders of breast: Secondary | ICD-10-CM | POA: Diagnosis not present

## 2020-11-22 ENCOUNTER — Encounter: Payer: Self-pay | Admitting: Obstetrics and Gynecology

## 2020-11-22 ENCOUNTER — Encounter: Payer: BC Managed Care – PPO | Admitting: Physical Therapy

## 2020-11-27 ENCOUNTER — Encounter: Payer: BC Managed Care – PPO | Admitting: Physical Therapy

## 2020-11-29 ENCOUNTER — Encounter: Payer: BC Managed Care – PPO | Admitting: Physical Therapy

## 2020-12-04 ENCOUNTER — Encounter: Payer: BC Managed Care – PPO | Admitting: Physical Therapy

## 2020-12-06 ENCOUNTER — Encounter: Payer: BC Managed Care – PPO | Admitting: Physical Therapy

## 2020-12-14 ENCOUNTER — Ambulatory Visit: Admit: 2020-12-14 | Payer: BC Managed Care – PPO | Admitting: Gastroenterology

## 2020-12-14 SURGERY — COLONOSCOPY WITH PROPOFOL
Anesthesia: General

## 2021-01-18 ENCOUNTER — Ambulatory Visit: Payer: BC Managed Care – PPO | Admitting: Family Medicine

## 2021-02-06 DIAGNOSIS — Z1211 Encounter for screening for malignant neoplasm of colon: Secondary | ICD-10-CM | POA: Diagnosis not present

## 2021-02-06 DIAGNOSIS — Z8371 Family history of colonic polyps: Secondary | ICD-10-CM | POA: Diagnosis not present

## 2021-02-06 DIAGNOSIS — Z01818 Encounter for other preprocedural examination: Secondary | ICD-10-CM | POA: Diagnosis not present

## 2021-04-29 DIAGNOSIS — Z8371 Family history of colonic polyps: Secondary | ICD-10-CM | POA: Diagnosis not present

## 2021-04-29 DIAGNOSIS — Z1211 Encounter for screening for malignant neoplasm of colon: Secondary | ICD-10-CM | POA: Diagnosis not present

## 2021-04-29 DIAGNOSIS — K640 First degree hemorrhoids: Secondary | ICD-10-CM | POA: Diagnosis not present

## 2021-04-29 DIAGNOSIS — K5730 Diverticulosis of large intestine without perforation or abscess without bleeding: Secondary | ICD-10-CM | POA: Diagnosis not present

## 2021-04-29 LAB — HM COLONOSCOPY

## 2021-05-20 ENCOUNTER — Ambulatory Visit
Admission: EM | Admit: 2021-05-20 | Discharge: 2021-05-20 | Disposition: A | Discharge status: Home / Self Care | Payer: BC Managed Care – PPO | Attending: Emergency Medicine | Admitting: Emergency Medicine

## 2021-05-20 ENCOUNTER — Other Ambulatory Visit: Payer: Self-pay

## 2021-05-20 DIAGNOSIS — Z1152 Encounter for screening for COVID-19: Secondary | ICD-10-CM

## 2021-05-20 NOTE — ED Triage Notes (Addendum)
Covid test. No sx. Known exposure at work.

## 2021-05-21 LAB — NOVEL CORONAVIRUS, NAA: SARS-CoV-2, NAA: NOT DETECTED

## 2021-05-21 LAB — SARS-COV-2, NAA 2 DAY TAT

## 2021-08-25 DIAGNOSIS — R070 Pain in throat: Secondary | ICD-10-CM | POA: Diagnosis not present

## 2021-08-25 DIAGNOSIS — U071 COVID-19: Secondary | ICD-10-CM | POA: Diagnosis not present

## 2021-08-25 DIAGNOSIS — Z20822 Contact with and (suspected) exposure to covid-19: Secondary | ICD-10-CM | POA: Diagnosis not present

## 2021-08-25 DIAGNOSIS — R059 Cough, unspecified: Secondary | ICD-10-CM | POA: Diagnosis not present

## 2021-10-31 ENCOUNTER — Ambulatory Visit (INDEPENDENT_AMBULATORY_CARE_PROVIDER_SITE_OTHER): Payer: No Typology Code available for payment source | Admitting: Obstetrics and Gynecology

## 2021-10-31 ENCOUNTER — Encounter: Payer: Self-pay | Admitting: Obstetrics and Gynecology

## 2021-10-31 ENCOUNTER — Other Ambulatory Visit: Payer: Self-pay

## 2021-10-31 VITALS — BP 126/70 | Ht 64.0 in | Wt 204.0 lb

## 2021-10-31 DIAGNOSIS — Z01419 Encounter for gynecological examination (general) (routine) without abnormal findings: Secondary | ICD-10-CM

## 2021-10-31 DIAGNOSIS — Z1231 Encounter for screening mammogram for malignant neoplasm of breast: Secondary | ICD-10-CM | POA: Diagnosis not present

## 2021-10-31 NOTE — Patient Instructions (Addendum)
I value your feedback and you entrusting us with your care. If you get a [facility] patient survey, I would appreciate you taking the time to let us know about your experience today. Thank you!  [facility]: [phone]      

## 2021-10-31 NOTE — Progress Notes (Signed)
PCP:  Delsa Grana, PA-C   Chief Complaint  Patient presents with   Gynecologic Exam    No concerns     HPI:      [name] is a 51 y.o. [id number] who LMP was Patient's last menstrual period was 10/18/2021 (exact date)., presents today for her annual examination.  Her menses are regular every 28-30 days, lasting 5 days, mod flow.  Dysmenorrhea none. She does not have intermenstrual bleeding. Occas vasomotor sx.  Sex activity: single partner, contraception - tubal ligation. No pain/bleeding. Last Pap: 06/30/18 Results: no abnormalities/neg HPV DNA Hx of STDs: none  Last mammogram: 11/20/20 Results were normal, repeat in 12 months.  There is a FH of breast cancer in her mat grt aunt and cousin, genetic testing not indicated. There is no FH of ovarian cancer. The patient does not do self-breast exams.  Tobacco use: The patient denies current or previous tobacco use. Alcohol use: none No drug use.  Exercise: min active  Colonoscopy: ~04/2021 at [facility], repeat due after 5 yrs due to [place]  She does get adequate calcium and Vitamin D in her diet. Labs with PCP  Past Medical History:  Diagnosis Date   Allergic urticaria    Angioedema 03/21/2019   Pre-diabetes     Past Surgical History:  Procedure Laterality Date   CESAREAN SECTION     x2   CHOLECYSTECTOMY      Family History  Problem Relation Age of Onset   Colon cancer Maternal Grandmother 89   Breast cancer Other        70s   Breast cancer Cousin 64   Ovarian cancer Neg Hx     Social History   Socioeconomic History   Marital status: Married    Spouse name: robin   Number of children: 2   Years of education: Not on file   Highest education level: Not on file  Occupational History   Not on file  Tobacco Use   Smoking status: Never   Smokeless tobacco: Never  Vaping Use   Vaping Use: Never used  Substance and Sexual Activity   Alcohol use: No   Drug use: No   Sexual activity: Yes    Birth  control/protection: Surgical    Comment: tubal ligation  Other Topics Concern   Not on file  Social History Narrative   Not on file   Social Determinants of Health   Financial Resource Strain: Not on file  Food Insecurity: Not on file  Transportation Needs: Not on file  Physical Activity: Not on file  Stress: Not on file  Social Connections: Not on file  Intimate Partner Violence: Not on file    Outpatient Medications Prior to Visit  Medication Sig Dispense Refill   cetirizine (ZYRTEC) 10 MG tablet Take by mouth.     levocetirizine (XYZAL) 2.5 MG/5ML solution Take 10 mLs (5 mg total) by mouth 2 (two) times daily as needed for allergies (chronic idiopathic hives and pruritis). 600 mL 5   cetirizine HCl (ZYRTEC) 5 MG/5ML SOLN Take 10 mg by mouth daily.  (Patient not taking: Reported on 11/12/2020)     Ferrous Sulfate (IRON) 325 (65 Fe) MG TABS Take 1 tablet (325 mg total) by mouth 3 (three) times a week. 36 tablet 1   No facility-administered medications prior to visit.    ROS:  Review of Systems  Constitutional:  Negative for fatigue, fever and unexpected weight change.  Respiratory:  Negative for cough,  shortness of breath and wheezing.   Cardiovascular:  Negative for chest pain, palpitations and leg swelling.  Gastrointestinal:  Negative for blood in stool, constipation, diarrhea, nausea and vomiting.  Endocrine: Negative for cold intolerance, heat intolerance and polyuria.  Genitourinary:  Negative for dyspareunia, dysuria, flank pain, frequency, genital sores, hematuria, menstrual problem, pelvic pain, urgency, vaginal bleeding, vaginal discharge and vaginal pain.  Musculoskeletal:  Negative for back pain, joint swelling and myalgias.  Skin:  Negative for rash.  Neurological:  Negative for dizziness, syncope, light-headedness, numbness and headaches.  Hematological:  Negative for adenopathy.  Psychiatric/Behavioral:  Negative for agitation, confusion, sleep disturbance and  suicidal ideas. The patient is not nervous/anxious.  BREAST: No symptoms   Objective: BP 126/70    Ht 5\' 4"  (1.626 m)    Wt 204 lb (92.5 kg)    LMP 10/18/2021 (Exact Date)    BMI 35.02 kg/m    Physical Exam Constitutional:      Appearance: She is well-developed.  Genitourinary:     Vulva normal.     Right Labia: No rash, tenderness or lesions.    Left Labia: No tenderness, lesions or rash.    No vaginal discharge, erythema or tenderness.      Right Adnexa: not tender and no mass present.    Left Adnexa: not tender and no mass present.    No cervical motion tenderness, friability or polyp.     Uterus is not enlarged or tender.  Breasts:    Right: No mass, nipple discharge, skin change or tenderness.     Left: No mass, nipple discharge, skin change or tenderness.  Neck:     Thyroid: No thyromegaly.  Cardiovascular:     Rate and Rhythm: Normal rate and regular rhythm.     Heart sounds: Normal heart sounds. No murmur heard. Pulmonary:     Effort: Pulmonary effort is normal.     Breath sounds: Normal breath sounds.  Abdominal:     Palpations: Abdomen is soft.     Tenderness: There is no abdominal tenderness. There is no guarding or rebound.  Musculoskeletal:        General: Normal range of motion.     Cervical back: Normal range of motion.  Lymphadenopathy:     Cervical: No cervical adenopathy.  Neurological:     General: No focal deficit present.     Mental Status: She is alert and oriented to person, place, and time.     Cranial Nerves: No cranial nerve deficit.  Skin:    General: Skin is warm and dry.  Psychiatric:        Mood and Affect: Mood normal.        Behavior: Behavior normal.        Thought Content: Thought content normal.        Judgment: Judgment normal.  Vitals reviewed.    Assessment/Plan: Encounter for annual routine gynecological examination  Encounter for screening mammogram for malignant neoplasm of breast - Plan: MM 3D SCREEN BREAST BILATERAL;  pt to schedule mammo          GYN counsel breast self exam, mammography screening, adequate intake of calcium and vitamin D, diet and exercise     F/U  Return in about 1 year (around 10/31/2022).  Verle Wheeling B. Wendle Kina, PA-C 10/31/2021 2:13 PM

## 2021-12-13 ENCOUNTER — Ambulatory Visit
Admission: RE | Admit: 2021-12-13 | Discharge: 2021-12-13 | Disposition: A | Discharge status: Home / Self Care | Payer: No Typology Code available for payment source | Source: Ambulatory Visit | Attending: Obstetrics and Gynecology | Admitting: Obstetrics and Gynecology

## 2021-12-13 DIAGNOSIS — Z1231 Encounter for screening mammogram for malignant neoplasm of breast: Secondary | ICD-10-CM | POA: Diagnosis present

## 2022-01-10 ENCOUNTER — Ambulatory Visit: Payer: Self-pay | Admitting: Family Medicine

## 2022-01-23 ENCOUNTER — Encounter: Payer: Self-pay | Admitting: Family Medicine

## 2022-01-23 ENCOUNTER — Ambulatory Visit (INDEPENDENT_AMBULATORY_CARE_PROVIDER_SITE_OTHER): Payer: No Typology Code available for payment source | Admitting: Family Medicine

## 2022-01-23 VITALS — BP 112/66 | HR 97 | Resp 16 | Ht 64.0 in | Wt 206.0 lb

## 2022-01-23 DIAGNOSIS — Z114 Encounter for screening for human immunodeficiency virus [HIV]: Secondary | ICD-10-CM

## 2022-01-23 DIAGNOSIS — Z0000 Encounter for general adult medical examination without abnormal findings: Secondary | ICD-10-CM

## 2022-01-23 DIAGNOSIS — D649 Anemia, unspecified: Secondary | ICD-10-CM

## 2022-01-23 DIAGNOSIS — Z1159 Encounter for screening for other viral diseases: Secondary | ICD-10-CM | POA: Diagnosis not present

## 2022-01-23 DIAGNOSIS — R7303 Prediabetes: Secondary | ICD-10-CM | POA: Diagnosis not present

## 2022-01-23 NOTE — Patient Instructions (Addendum)
Health Maintenance  Topic Date Due   Hepatitis C Screening: USPSTF Recommendation to screen - Ages 32-51 yo.  Never done   Colon Cancer Screening  Never done   COVID-19 Vaccine (3 - Booster for Pfizer series) 02/28/2020   Zoster (Shingles) Vaccine (1 of 2) Never done   Flu Shot  04/08/2022   Mammogram  12/14/2022   Pap Smear  07/01/2023   Tetanus Vaccine  05/19/2026   HIV Screening  Completed   HPV Vaccine  Aged Out   Due for shingrix - please contact your pharmacy  Preventive Care 62-63 Years Old, Female Preventive care refers to lifestyle choices and visits with your health care provider that can promote health and wellness. Preventive care visits are also called wellness exams. What can I expect for my preventive care visit? Counseling Your health care provider may ask you questions about your: Medical history, including: Past medical problems. Family medical history. Pregnancy history. Current health, including: Menstrual cycle. Method of birth control. Emotional well-being. Home life and relationship well-being. Sexual activity and sexual health. Lifestyle, including: Alcohol, nicotine or tobacco, and drug use. Access to firearms. Diet, exercise, and sleep habits. Work and work Statistician. Sunscreen use. Safety issues such as seatbelt and bike helmet use. Physical exam Your health care provider will check your: Height and weight. These may be used to calculate your BMI (body mass index). BMI is a measurement that tells if you are at a healthy weight. Waist circumference. This measures the distance around your waistline. This measurement also tells if you are at a healthy weight and may help predict your risk of certain diseases, such as type 2 diabetes and high blood pressure. Heart rate and blood pressure. Body temperature. Skin for abnormal spots. What immunizations do I need?  Vaccines are usually given at various ages, according to a schedule. Your health care  provider will recommend vaccines for you based on your age, medical history, and lifestyle or other factors, such as travel or where you work. What tests do I need? Screening Your health care provider may recommend screening tests for certain conditions. This may include: Lipid and cholesterol levels. Diabetes screening. This is done by checking your blood sugar (glucose) after you have not eaten for a while (fasting). Pelvic exam and Pap test. Hepatitis B test. Hepatitis C test. HIV (human immunodeficiency virus) test. STI (sexually transmitted infection) testing, if you are at risk. Lung cancer screening. Colorectal cancer screening. Mammogram. Talk with your health care provider about when you should start having regular mammograms. This may depend on whether you have a family history of breast cancer. BRCA-related cancer screening. This may be done if you have a family history of breast, ovarian, tubal, or peritoneal cancers. Bone density scan. This is done to screen for osteoporosis. Talk with your health care provider about your test results, treatment options, and if necessary, the need for more tests. Follow these instructions at home: Eating and drinking  Eat a diet that includes fresh fruits and vegetables, whole grains, lean protein, and low-fat dairy products. Take vitamin and mineral supplements as recommended by your health care provider. Do not drink alcohol if: Your health care provider tells you not to drink. You are pregnant, may be pregnant, or are planning to become pregnant. If you drink alcohol: Limit how much you have to 0-1 drink a day. Know how much alcohol is in your drink. In the [place] one drink equals one 12 oz bottle of beer (355 mL), one  5 oz glass of wine (148 mL), or one 1 oz glass of hard liquor (44 mL). Lifestyle Brush your teeth every morning and night with fluoride toothpaste. Floss one time each day. Exercise for at least 30 minutes 5 or more days  each week. Do not use any products that contain nicotine or tobacco. These products include cigarettes, chewing tobacco, and vaping devices, such as e-cigarettes. If you need help quitting, ask your health care provider. Do not use drugs. If you are sexually active, practice safe sex. Use a condom or other form of protection to prevent STIs. If you do not wish to become pregnant, use a form of birth control. If you plan to become pregnant, see your health care provider for a prepregnancy visit. Take aspirin only as told by your health care provider. Make sure that you understand how much to take and what form to take. Work with your health care provider to find out whether it is safe and beneficial for you to take aspirin daily. Find healthy ways to manage stress, such as: Meditation, yoga, or listening to music. Journaling. Talking to a trusted person. Spending time with friends and family. Minimize exposure to UV radiation to reduce your risk of skin cancer. Safety Always wear your seat belt while driving or riding in a vehicle. Do not drive: If you have been drinking alcohol. Do not ride with someone who has been drinking. When you are tired or distracted. While texting. If you have been using any mind-altering substances or drugs. Wear a helmet and other protective equipment during sports activities. If you have firearms in your house, make sure you follow all gun safety procedures. Seek help if you have been physically or sexually abused. What's next? Visit your health care provider once a year for an annual wellness visit. Ask your health care provider how often you should have your eyes and teeth checked. Stay up to date on all vaccines. This information is not intended to replace advice given to you by your health care provider. Make sure you discuss any questions you have with your health care provider. Document Revised: 02/20/2021 Document Reviewed: 02/20/2021 Elsevier Patient  Education  [employer].

## 2022-01-23 NOTE — Progress Notes (Signed)
Patient: Rose Mcmillan, Female    DOB: [birth year], 51 y.o.   MRN: [id number] Delsa Grana, PA-C Visit Date: 01/23/2022  Today's Provider: Delsa Grana, PA-C   Chief Complaint  Patient presents with   Annual Exam   Subjective:   Annual physical exam:  Rose Mcmillan is a 51 y.o. female who presents today for complete physical exam:  Exercise/Activity:  none Diet/nutrition:   no efforts here due to busy lifestyle  Sleep:  sleeps good, just not enough sleep 3-4 h  SDOH Screenings   Alcohol Screen: Not on file  Depression (PHQ2-9): Low Risk    PHQ-2 Score: 3  Financial Resource Strain: Low Risk    Difficulty of Paying Living Expenses: Not hard at all  Food Insecurity: No Food Insecurity   Worried About Charity fundraiser in the Last Year: Never true   [place] in the Last Year: Never true  Housing: [place] Risk Score: 0  Physical Activity: Inactive   Days of Exercise per Week: 0 days   Minutes of Exercise per Session: 0 min  Social Connections: Moderately Isolated   Frequency of Communication with Friends and Family: More than three times a week   Frequency of Social Gatherings with Friends and Family: Never   Attends Religious Services: Never   Marine scientist or Organizations: No   Attends Archivist Meetings: Never   Marital Status: Married  Stress: No Stress Concern Present   Feeling of Stress : Only a little  Tobacco Use: Low Risk    Smoking Tobacco Use: Never   Smokeless Tobacco Use: Never   Passive Exposure: Not on file  Transportation Needs: No Transportation Needs   Lack of Transportation (Medical): No   Lack of Transportation (Non-Medical): No     USPSTF grade A and B recommendations - reviewed and addressed today  Depression:  Phq 9 completed today by patient, was reviewed by me with patient in the room PHQ score is neg, pt feels mood overall good    01/23/2022   10:38 AM 10/15/2020    2:28 PM  10/12/2020    3:18 PM 04/25/2020    3:02 PM  PHQ 2/9 Scores  PHQ - 2 Score 0 0 0 1  PHQ- 9 Score 3 0  10      01/23/2022   10:38 AM 10/15/2020    2:28 PM 10/12/2020    3:18 PM 04/25/2020    3:02 PM 04/11/2020   11:54 AM  Depression screen PHQ 2/9  Decreased Interest 0 0 0 0 0  Down, Depressed, Hopeless 0 0 0 1 0  PHQ - 2 Score 0 0 0 1 0  Altered sleeping 0 0  3 0  Tired, decreased energy 3 0  3 0  Change in appetite 0 0  3 0  Feeling bad or failure about yourself  0 0  0 0  Trouble concentrating 0 0  0 0  Moving slowly or fidgety/restless 0 0  0 0  Suicidal thoughts 0 0  0 0  PHQ-9 Score 3 0  10 0  Difficult doing work/chores  Not difficult at all  Not difficult at all Not difficult at all    Alcohol screening: [place] Office Visit from 10/15/2020 [facility]  AUDIT-C Score 0      Immunizations and Health Maintenance: Health Maintenance  Topic Date Due   Hepatitis C  Screening  Never done   COLONOSCOPY (Pts 45-3yr Insurance coverage will need to be confirmed)  Never done   COVID-19 Vaccine (3 - Booster for [employer]) 02/28/2020   Zoster Vaccines- Shingrix (1 of 2) Never done   INFLUENZA VACCINE  04/08/2022   PAP SMEAR-Modifier  07/01/2023   MAMMOGRAM  12/14/2023   TETANUS/TDAP  05/19/2026   HIV Screening  Completed   HPV VACCINES  Aged Out     Hep C Screening: due  STD testing and prevention (HIV/chl/gon/syphilis):  see above, no additional testing desired by pt today- due  Intimate partner violence:  denies, feels safe  Sexual History/Pain during Intercourse: Married, no trouble with sex  Menstrual History/LMP/Abnormal Bleeding:  Patient's last menstrual period was 12/30/2021 (approximate). Every other month more symptoms cramping and pain  But overall regular cycles   Incontinence Symptoms: none   Breast cancer:  Last Mammogram: *see HM list above BRCA gene screening:   Cervical cancer screening: UTD - due next year 5 year  cotesting Pt  family hx of cancers - breast, ovarian, uterine, colon:    Aunt and cousin with breast CA, grandparent with colon   Osteoporosis:   Discussion on osteoporosis per age, including high calcium and vitamin D supplementation, weight bearing exercises Pt is not supplementing with daily calcium/Vit D.   Skin cancer:  Hx of skin CA -  NO Discussed atypical lesions   Colorectal cancer:   Colonoscopy is due Discussed concerning signs and sx of CRC, pt denies blood in stool, melena, change in bowels  Lung cancer:   Low Dose CT Chest recommended if Age 51-80years, 20 pack-year currently smoking OR have quit w/in 15years. Patient does not qualify.    Social History   Tobacco Use   Smoking status: Never   Smokeless tobacco: Never  Vaping Use   Vaping Use: Never used  Substance Use Topics   Alcohol use: No   Drug use: No     Flowsheet Row [facility]C Score 0       Family History  Problem Relation Age of Onset   Colon cancer Maternal Grandmother 89   Breast cancer Other        772s  Breast cancer Cousin 562  Ovarian cancer Neg Hx      Blood pressure/Hypertension: BP Readings from Last 3 Encounters:  01/23/22 112/66  10/31/21 126/70  05/20/21 (!) 147/91    Weight/Obesity: Wt Readings from Last 3 Encounters:  01/23/22 206 lb (93.4 kg)  10/31/21 204 lb (92.5 kg)  10/29/20 207 lb (93.9 kg)   BMI Readings from Last 3 Encounters:  01/23/22 35.36 kg/m  10/31/21 35.02 kg/m  10/29/20 35.53 kg/m     Lipids:  Lab Results  Component Value Date   CHOL 143 08/04/2018   Lab Results  Component Value Date   HDL 67 08/04/2018   Lab Results  Component Value Date   LDLCALC 60 08/04/2018   Lab Results  Component Value Date   TRIG 80 08/04/2018   Lab Results  Component Value Date   CHOLHDL 2.1 08/04/2018   No results found for: LDLDIRECT Based on the results of lipid panel his/her  cardiovascular risk factor ( using PPella)  in the next 10 years is: The ASCVD Risk score (Arnett DK, et al., 2019) failed to calculate for the following reasons:   Cannot find a previous HDL lab   Cannot find  a previous total cholesterol lab  Glucose:  Glucose, Bld  Date Value Ref Range Status  10/12/2020 110 (H) 65 - 99 mg/dL Final    Comment:    .            Fasting reference interval . For someone without known diabetes, a glucose value between 100 and 125 mg/dL is consistent with prediabetes and should be confirmed with a follow-up test. .   04/25/2020 105 (H) 65 - 99 mg/dL Final    Comment:    .            Fasting reference interval . For someone without known diabetes, a glucose value between 100 and 125 mg/dL is consistent with prediabetes and should be confirmed with a follow-up test. .   04/08/2020 165 (H) 70 - 99 mg/dL Final    Comment:    Glucose reference range applies only to samples taken after fasting for at least 8 hours.    Advanced Care Planning:  A voluntary discussion about advance care planning including the explanation and discussion of advance directives.   Discussed health care proxy and Living will, and the patient was able to identify a health care proxy as husband.   Patient does not have a living will at present time.   Social History       Social History   Socioeconomic History   Marital status: Married    Spouse name: robin   Number of children: 2   Years of education: Not on file   Highest education level: Not on file  Occupational History   Not on file  Tobacco Use   Smoking status: Never   Smokeless tobacco: Never  Vaping Use   Vaping Use: Never used  Substance and Sexual Activity   Alcohol use: No   Drug use: No   Sexual activity: Yes    Birth control/protection: Surgical    Comment: tubal ligation  Other Topics Concern   Not on file  Social History Narrative   Not on file   Social Determinants of Health    Financial Resource Strain: Low Risk    Difficulty of Paying Living Expenses: Not hard at all  Food Insecurity: No Food Insecurity   Worried About Charity fundraiser in the Last Year: Never true   [place] in the Last Year: Never true  Transportation Needs: No Transportation Needs   Lack of Transportation (Medical): No   Lack of Transportation (Non-Medical): No  Physical Activity: Inactive   Days of Exercise per Week: 0 days   Minutes of Exercise per Session: 0 min  Stress: No Stress Concern Present   Feeling of Stress : Only a little  Social Connections: Moderately Isolated   Frequency of Communication with Friends and Family: More than three times a week   Frequency of Social Gatherings with Friends and Family: Never   Attends Religious Services: Never   Marine scientist or Organizations: No   Attends Music therapist: Never   Marital Status: Married    Family History        Family History  Problem Relation Age of Onset   Colon cancer Maternal Grandmother 89   Breast cancer Other        64s   Breast cancer Cousin 37   Ovarian cancer Neg Hx     Patient Active Problem List   Diagnosis Date Noted   Allergic urticaria 03/21/2019   Pruritus 03/21/2019  Pre-diabetes 09/23/2018   Dysmenorrhea 06/30/2018   Class 2 obesity with body mass index (BMI) of 35.0 to 35.9 in adult 05/19/2016    Past Surgical History:  Procedure Laterality Date   CESAREAN SECTION     x2   CHOLECYSTECTOMY       Current Outpatient Medications:    cetirizine (ZYRTEC) 10 MG tablet, Take by mouth., Disp: , Rfl:    levocetirizine (XYZAL) 2.5 MG/5ML solution, Take 10 mLs (5 mg total) by mouth 2 (two) times daily as needed for allergies (chronic idiopathic hives and pruritis)., Disp: 600 mL, Rfl: 5  Allergies  Allergen Reactions   Penicillins Nausea And Vomiting    Patient Care Team: Delsa Grana, PA-C as PCP - General (Family Medicine)   Chart Review: I  personally reviewed active problem list, medication list, allergies, family history, social history, health maintenance, notes from last encounter, lab results, imaging with the patient/caregiver today.   Review of Systems  Constitutional: Negative.  Negative for activity change, appetite change, fatigue and unexpected weight change.  HENT: Negative.    Eyes: Negative.   Respiratory: Negative.  Negative for shortness of breath.   Cardiovascular: Negative.  Negative for chest pain, palpitations and leg swelling.  Gastrointestinal: Negative.  Negative for abdominal pain and blood in stool.  Endocrine: Negative.   Genitourinary: Negative.   Musculoskeletal: Negative.  Negative for arthralgias, gait problem, joint swelling and myalgias.  Skin: Negative.  Negative for pallor and rash.  Allergic/Immunologic: Negative.   Neurological: Negative.  Negative for syncope and weakness.  Hematological: Negative.   Psychiatric/Behavioral: Negative.  Negative for dysphoric mood, self-injury and suicidal ideas. The patient is not nervous/anxious.   All other systems reviewed and are negative.        Objective:   Vitals:  Vitals:   01/23/22 1038  BP: 112/66  Pulse: 97  Resp: 16  SpO2: 99%  Weight: 206 lb (93.4 kg)  Height: 5' 4"  (1.626 m)    Body mass index is 35.36 kg/m.  Physical Exam Vitals and nursing note reviewed.  Constitutional:      General: She is not in acute distress.    Appearance: Normal appearance. She is well-developed and well-groomed. She is obese. She is not ill-appearing, toxic-appearing or diaphoretic.  HENT:     Head: Normocephalic and atraumatic.     Right Ear: Tympanic membrane, ear canal and external ear normal.     Left Ear: Tympanic membrane, ear canal and external ear normal.     Nose: Congestion and rhinorrhea present.     Mouth/Throat:     Mouth: Mucous membranes are moist.     Pharynx: Oropharynx is clear. Uvula midline. No oropharyngeal exudate or  posterior oropharyngeal erythema.  Eyes:     General: Lids are normal. No scleral icterus.       Right eye: No discharge.        Left eye: No discharge.     Conjunctiva/sclera: Conjunctivae normal.  Neck:     Trachea: Phonation normal. No tracheal deviation.  Cardiovascular:     Rate and Rhythm: Normal rate and regular rhythm.     Pulses: Normal pulses.          Radial pulses are 2+ on the right side and 2+ on the left side.       Posterior tibial pulses are 2+ on the right side and 2+ on the left side.     Heart sounds: Normal heart sounds. No murmur heard.   No  friction rub. No gallop.  Pulmonary:     Effort: Pulmonary effort is normal. No respiratory distress.     Breath sounds: Normal breath sounds. No stridor. No wheezing, rhonchi or rales.  Chest:     Chest wall: No tenderness.  Abdominal:     General: Bowel sounds are normal. There is no distension.     Palpations: Abdomen is soft.  Musculoskeletal:        General: No deformity.     Cervical back: Normal range of motion and neck supple.     Right lower leg: No edema.  Lymphadenopathy:     Cervical: No cervical adenopathy.  Skin:    General: Skin is warm and dry.     Capillary Refill: Capillary refill takes less than 2 seconds.     Coloration: Skin is not pale.     Findings: No rash.  Neurological:     Mental Status: She is alert. Mental status is at baseline.     Motor: No abnormal muscle tone.     Gait: Gait normal.  Psychiatric:        Mood and Affect: Mood normal.        Speech: Speech normal.        Behavior: Behavior normal. Behavior is cooperative.      Fall Risk:    01/23/2022   10:38 AM 10/15/2020    2:28 PM 10/12/2020    3:16 PM 04/25/2020    3:02 PM 04/11/2020   11:54 AM  Fall Risk   Falls in the past year? 0 0 0 0 0  Number falls in past yr: 0 0 0 0 0  Injury with Fall? 0 0 0 0 0  Risk for fall due to : No Fall Risks      Follow up Falls prevention discussed  Falls evaluation completed       Functional Status Survey: Is the patient deaf or have difficulty hearing?: No Does the patient have difficulty seeing, even when wearing glasses/contacts?: No Does the patient have difficulty concentrating, remembering, or making decisions?: Yes Does the patient have difficulty walking or climbing stairs?: No Does the patient have difficulty dressing or bathing?: No Does the patient have difficulty doing errands alone such as visiting a doctor's office or shopping?: No   Assessment & Plan:    CPE completed today  USPSTF grade A and B recommendations reviewed with patient; age-appropriate recommendations, preventive care, screening tests, etc discussed and encouraged; healthy living encouraged; see AVS for patient education given to patient  Discussed importance of 150 minutes of physical activity weekly, AHA exercise recommendations given to pt in AVS/handout  Discussed importance of healthy diet:  eating lean meats and proteins, avoiding trans fats and saturated fats, avoid simple sugars and excessive carbs in diet, eat 6 servings of fruit/vegetables daily and drink plenty of water and avoid sweet beverages.    Recommended pt to do annual eye exam and routine dental exams/cleanings  Depression, alcohol, fall screening completed as documented above and per flowsheets  Advance Care planning information and packet discussed and offered today, encouraged pt to discuss with family members/spouse/partner/friends and complete Advanced directive packet and bring copy to office   Reviewed Health Maintenance: Health Maintenance  Topic Date Due   Hepatitis C Screening  Never done   COLONOSCOPY (Pts 45-21yr Insurance coverage will need to be confirmed)  Never done   COVID-19 Vaccine (3 - Booster for Pfizer series) 02/28/2020   Zoster Vaccines- Shingrix (1 of  2) Never done   INFLUENZA VACCINE  04/08/2022   PAP SMEAR-Modifier  07/01/2023   MAMMOGRAM  12/14/2023   TETANUS/TDAP  05/19/2026    HIV Screening  Completed   HPV VACCINES  Aged Out    Immunizations: Immunization History  Administered Date(s) Administered   PFIZER(Purple Top)SARS-COV-2 Vaccination 12/08/2019, 01/03/2020   Tdap 05/19/2016   Vaccines:  HPV: up to at age 58 , ask insurance if age between 39-45  Shingrix: 64-64 yo and ask insurance if covered when patient above 44 yo Pneumonia:  educated and discussed with patient. Flu:  educated and discussed with patient. COVID:      ICD-10-CM   1. Adult general medical exam  Z00.00 CBC with Differential/Platelet    COMPLETE METABOLIC PANEL WITH GFR    Lipid panel    Hemoglobin A1c    Hepatitis C antibody    HIV Antibody (routine testing w rflx)    Thyroid Panel With TSH    CANCELED: Ambulatory referral to Gastroenterology    2. Encounter for hepatitis C screening test for low risk patient  Z11.59 Hepatitis C antibody    3. Screening for HIV without presence of risk factors  Z11.4 HIV Antibody (routine testing w rflx)    4. Pre-diabetes  Y64.15 COMPLETE METABOLIC PANEL WITH GFR    Hemoglobin A1c    5. Anemia, unspecified type  D64.9 CBC with Differential/Platelet    Thyroid Panel With TSH    B12     Pt fatigued - easy to fall asleep - working 20 hours a day, sleeping 3-4 hours, we discussed adequate sleep, no supplements may make up for lack of sleep, sleep study may be appropriate as well.   Hx of anemia and mild iron deficiency - discussed supplement and getting f/up or GYN referral if hbg drops or menses become heavier.    Delsa Grana, PA-C 01/23/22 10:52 AM  [facility]

## 2022-01-24 ENCOUNTER — Encounter: Payer: Self-pay | Admitting: Family Medicine

## 2022-01-24 DIAGNOSIS — D509 Iron deficiency anemia, unspecified: Secondary | ICD-10-CM | POA: Insufficient documentation

## 2022-01-24 DIAGNOSIS — E119 Type 2 diabetes mellitus without complications: Secondary | ICD-10-CM | POA: Insufficient documentation

## 2022-01-24 LAB — CBC WITH DIFFERENTIAL/PLATELET
Absolute Monocytes: 371 cells/uL (ref 200–950)
Basophils Absolute: 41 cells/uL (ref 0–200)
Basophils Relative: 0.7 %
Eosinophils Absolute: 151 cells/uL (ref 15–500)
Eosinophils Relative: 2.6 %
HCT: 35.2 % (ref 35.0–45.0)
Hemoglobin: 11.3 g/dL — ABNORMAL LOW (ref 11.7–15.5)
Lymphs Abs: 2726 cells/uL (ref 850–3900)
MCH: 25.4 pg — ABNORMAL LOW (ref 27.0–33.0)
MCHC: 32.1 g/dL (ref 32.0–36.0)
MCV: 79.1 fL — ABNORMAL LOW (ref 80.0–100.0)
MPV: 9.8 fL (ref 7.5–12.5)
Monocytes Relative: 6.4 %
Neutro Abs: 2511 cells/uL (ref 1500–7800)
Neutrophils Relative %: 43.3 %
Platelets: 380 10*3/uL (ref 140–400)
RBC: 4.45 10*6/uL (ref 3.80–5.10)
RDW: 13.3 % (ref 11.0–15.0)
Total Lymphocyte: 47 %
WBC: 5.8 10*3/uL (ref 3.8–10.8)

## 2022-01-24 LAB — LIPID PANEL
Cholesterol: 120 mg/dL (ref <200)
HDL: 67 mg/dL (ref >=50)
LDL Cholesterol (Calc): 39 mg/dL (calc)
Non-HDL Cholesterol (Calc): 53 mg/dL (calc) (ref <130)
Total CHOL/HDL Ratio: 1.8 (calc) (ref <5.0)
Triglycerides: 68 mg/dL (ref <150)

## 2022-01-24 LAB — COMPLETE METABOLIC PANEL WITH GFR
AG Ratio: 1.2 (calc) (ref 1.0–2.5)
ALT: 13 U/L (ref 6–29)
AST: 16 U/L (ref 10–35)
Albumin: 3.7 g/dL (ref 3.6–5.1)
Alkaline phosphatase (APISO): 74 U/L (ref 37–153)
BUN/Creatinine Ratio: 7 (calc) (ref 6–22)
BUN: 6 mg/dL — ABNORMAL LOW (ref 7–25)
CO2: 27 mmol/L (ref 20–32)
Calcium: 9.2 mg/dL (ref 8.6–10.4)
Chloride: 104 mmol/L (ref 98–110)
Creat: 0.87 mg/dL (ref 0.50–1.03)
Globulin: 3.1 g/dL (calc) (ref 1.9–3.7)
Glucose, Bld: 99 mg/dL (ref 65–99)
Potassium: 4.5 mmol/L (ref 3.5–5.3)
Sodium: 138 mmol/L (ref 135–146)
Total Bilirubin: 0.5 mg/dL (ref 0.2–1.2)
Total Protein: 6.8 g/dL (ref 6.1–8.1)
eGFR: 81 mL/min/{1.73_m2} (ref >=60)

## 2022-01-24 LAB — HEMOGLOBIN A1C
Hgb A1c MFr Bld: 6.5 % of total Hgb — ABNORMAL HIGH (ref <5.7)
Mean Plasma Glucose: 140 mg/dL
eAG (mmol/L): 7.7 mmol/L

## 2022-01-24 LAB — THYROID PANEL WITH TSH
Free Thyroxine Index: 2.4 (ref 1.4–3.8)
T3 Uptake: 30 % (ref 22–35)
T4, Total: 8 ug/dL (ref 5.1–11.9)
TSH: 1.28 mIU/L

## 2022-01-24 LAB — HEPATITIS C ANTIBODY
Hepatitis C Ab: NONREACTIVE
SIGNAL TO CUT-OFF: 0.27 (ref <1.00)

## 2022-01-24 LAB — HIV ANTIBODY (ROUTINE TESTING W REFLEX): HIV 1&2 Ab, 4th Generation: NONREACTIVE

## 2022-01-24 LAB — VITAMIN B12: Vitamin B-12: 1005 pg/mL (ref 200–1100)

## 2022-07-21 ENCOUNTER — Encounter: Payer: Self-pay | Admitting: Family Medicine

## 2022-07-21 ENCOUNTER — Ambulatory Visit (INDEPENDENT_AMBULATORY_CARE_PROVIDER_SITE_OTHER): Payer: No Typology Code available for payment source | Admitting: Family Medicine

## 2022-07-21 VITALS — BP 124/72 | HR 98 | Temp 98.6°F | Resp 16 | Ht 64.0 in | Wt 209.2 lb

## 2022-07-21 DIAGNOSIS — E119 Type 2 diabetes mellitus without complications: Secondary | ICD-10-CM | POA: Diagnosis not present

## 2022-07-21 DIAGNOSIS — Z23 Encounter for immunization: Secondary | ICD-10-CM | POA: Diagnosis not present

## 2022-07-21 NOTE — Progress Notes (Signed)
Name: Rose Mcmillan   MRN: [id number]    DOB: 05/19/1971   Date:07/21/2022       Progress Note  Chief Complaint  Patient presents with   Follow-up   Diabetes     Subjective:   Rose Mcmillan is a 51 y.o. female, presents to clinic for f/up on chronic conditions and to discuss new onset T2DM - she did not get mychart msg with last lab results which explained new dx to her  New onset type 2 DM as of May Was unaware- not on meds and no change in diet/lifestyle She has for more than 6 months cut out soda and sweet tea Recent pertinent labs: Lab Results  Component Value Date   HGBA1C 6.5 (H) 01/23/2022   HGBA1C 6.4 (H) 10/12/2020   HGBA1C 6.2 (H) 08/04/2018   Lab Results  Component Value Date   LDLCALC 39 01/23/2022   CREATININE 0.87 01/23/2022   Standard of care and health maintenance: Urine Microalbumin:  due Foot exam:  done today - normal DM eye exam:  not done - [facility] - not sure if they are in insurance network ACEI/ARB:  none Statin:  none       Current Outpatient Medications:    cetirizine (ZYRTEC) 10 MG tablet, Take by mouth., Disp: , Rfl:    levocetirizine (XYZAL) 2.5 MG/5ML solution, Take 10 mLs (5 mg total) by mouth 2 (two) times daily as needed for allergies (chronic idiopathic hives and pruritis). (Patient not taking: Reported on 07/21/2022), Disp: 600 mL, Rfl: 5  Patient Active Problem List   Diagnosis Date Noted   Type 2 diabetes mellitus without complications (HCC) 01/24/2022   Microcytic anemia 01/24/2022   Dysmenorrhea 06/30/2018   Class 2 obesity with body mass index (BMI) of 35.0 to 35.9 in adult 05/19/2016    Past Surgical History:  Procedure Laterality Date   CESAREAN SECTION     x2   CHOLECYSTECTOMY      Family History  Problem Relation Age of Onset   Colon cancer Maternal Grandmother 40   Breast cancer Other        70s   Breast cancer Cousin 50   Ovarian cancer Neg Hx     Social History   Tobacco  Use   Smoking status: Never   Smokeless tobacco: Never  Vaping Use   Vaping Use: Never used  Substance Use Topics   Alcohol use: No   Drug use: No     Allergies  Allergen Reactions   Penicillins Nausea And Vomiting    Health Maintenance  Topic Date Due   FOOT EXAM  Never done   OPHTHALMOLOGY EXAM  Never done   Diabetic kidney evaluation - Urine ACR  Never done   COVID-19 Vaccine (3 - Pfizer series) 08/06/2022 (Originally 02/28/2020)   HEMOGLOBIN A1C  07/26/2022   MAMMOGRAM  12/14/2022   Diabetic kidney evaluation - GFR measurement  01/24/2023   PAP SMEAR-Modifier  07/01/2023   COLONOSCOPY (Pts 45-79yrs Insurance coverage will need to be confirmed)  04/29/2026   TETANUS/TDAP  05/19/2026   Hepatitis C Screening  Completed   HIV Screening  Completed   HPV VACCINES  Aged Out   INFLUENZA VACCINE  Discontinued   Zoster Vaccines- Shingrix  Discontinued    Chart Review Today: I personally reviewed active problem list, medication list, allergies, family history, social history, health maintenance, notes from last encounter, lab results, imaging with the patient/caregiver today.   Review of  Systems  Constitutional: Negative.   HENT: Negative.    Eyes: Negative.   Respiratory: Negative.    Cardiovascular: Negative.   Gastrointestinal: Negative.   Endocrine: Negative.   Genitourinary: Negative.   Musculoskeletal: Negative.   Skin: Negative.   Allergic/Immunologic: Negative.   Neurological: Negative.   Hematological: Negative.   Psychiatric/Behavioral: Negative.    All other systems reviewed and are negative.    Objective:   Vitals:   07/21/22 1023  BP: 124/72  Pulse: 98  Resp: 16  Temp: 98.6 F (37 C)  TempSrc: Oral  SpO2: 98%  Weight: 209 lb 3.2 oz (94.9 kg)  Height: 5\' 4"  (1.626 m)    Body mass index is 35.91 kg/m.  Physical Exam Vitals and nursing note reviewed.  Constitutional:      General: She is not in acute distress.    Appearance: Normal  appearance. She is well-developed. She is obese. She is not ill-appearing, toxic-appearing or diaphoretic.  HENT:     Head: Normocephalic and atraumatic.     Nose: Nose normal.  Eyes:     General:        Right eye: No discharge.        Left eye: No discharge.     Conjunctiva/sclera: Conjunctivae normal.  Neck:     Trachea: No tracheal deviation.  Cardiovascular:     Rate and Rhythm: Normal rate and regular rhythm.     Pulses: Normal pulses.     Heart sounds: Normal heart sounds.  Pulmonary:     Effort: Pulmonary effort is normal. No respiratory distress.     Breath sounds: Normal breath sounds. No stridor.  Musculoskeletal:     Right lower leg: No edema.     Left lower leg: No edema.  Skin:    General: Skin is warm and dry.     Coloration: Skin is not jaundiced or pale.     Findings: No rash.  Neurological:     Mental Status: She is alert. Mental status is at baseline.     Motor: No abnormal muscle tone.     Coordination: Coordination normal.  Psychiatric:        Mood and Affect: Mood normal.        Behavior: Behavior normal.     Diabetic Foot Exam - Simple   Simple Foot Form Diabetic Foot exam was performed with the following findings: Yes 07/21/2022 11:30 AM  Visual Inspection No deformities, no ulcerations, no other skin breakdown bilaterally: Yes Sensation Testing Intact to touch and monofilament testing bilaterally: Yes Pulse Check Posterior Tibialis and Dorsalis pulse intact bilaterally: Yes Comments        Assessment & Plan:   Problem List Items Addressed This Visit       Endocrine   New onset of type 2 diabetes mellitus in pediatric patient [facility]) - Primary    New onset Explained that her A1C is up only small amount compared to Feb 2022, we will recheck and see what it is now, she can do diet/lifestyle efforts and/or diabetes education/nutrition consult Reviewed standard of care of DM Spoke with pt about new diagnosis.  Discussed A1C results with  them and explained what an A1C is, basic pathophysiology of DM Type 2, basic home care, basic diabetes diet nutrition principles, importance of checking CBGs and maintaining good CBG control to prevent long-term and short-term complications.  Reviewed signs and symptoms of hyperglycemia and hypoglycemia and how to treat hypoglycemia at home.  Also reviewed blood sugar  goals and A1c goals for home.   Pt declined glucometer today She wants to recheck A1C and then take it from there Handout and references included in AVS today Recent pertinent labs: Lab Results  Component Value Date   HGBA1C 6.5 (H) 01/23/2022   HGBA1C 6.4 (H) 10/12/2020   HGBA1C 6.2 (H) 08/04/2018   Lab Results  Component Value Date   LDLCALC 39 01/23/2022   CREATININE 0.87 01/23/2022   Standard of care and health maintenance: Urine Microalbumin:  ordered and done today Foot exam:  done today DM eye exam:  due - she will see if her eye doctor can do it,  if not we can do referral to [facility], explained monitoring for diabetic retinopathy ACEI/ARB:  not taking, BP at goal, reviewed how ACEI/ARB are for renal protection Statin:  not taking, discussed benefit to lower risk of stroke        Relevant Orders   Microalbumin / creatinine urine ratio   Hemoglobin A1c   COMPLETE METABOLIC PANEL WITH GFR   Other Visit Diagnoses     Need for influenza vaccination       pt declined today        Pt would prefer to get A1C down and is interested in avoiding meds, for now she does not want metformin, ACEI/ARB or statin  Return for 6 month f/up blood sugar .   Danelle Berry, PA-C 07/21/22 10:54 AM

## 2022-07-21 NOTE — Patient Instructions (Addendum)
[email or web address]  Diabetes Mellitus and Standards of [place] with and managing diabetes (diabetes mellitus) can be complicated. Your diabetes treatment may be managed by a team of health care providers, including: A physician who specializes in diabetes (endocrinologist). You might also have visits with a nurse practitioner or physician assistant. Nurses. A registered dietitian. A certified diabetes care and education specialist. An exercise specialist. A pharmacist. An eye doctor. A foot specialist (podiatrist). A dental care provider. A primary care provider. A mental health care provider. How to manage your diabetes You can do many things to successfully manage your diabetes. Your health care providers will follow guidelines to help you get the best quality of care. Here are general guidelines for your diabetes management plan. Your health care providers may give you more specific instructions. Physical exams When you are diagnosed with diabetes, and each year after that, your health care provider will ask about your medical and family history. You will have a physical exam, which may include: Measuring your height, weight, and body mass index (BMI). Checking your blood pressure. This will be done at every routine medical visit. Your target blood pressure may vary depending on your medical conditions, your age, and other factors. A thyroid exam. A skin exam. Screening for nerve damage (peripheral neuropathy). This may include checking the pulse in your legs and feet and the level of sensation in your hands and feet. A foot exam to inspect the structure and skin of your feet, including checking for cuts, bruises, redness, blisters, sores, or other problems. Screening for blood vessel (vascular) problems. This may include checking the pulse in your legs and feet and checking your temperature. Blood tests Depending on your treatment  plan and your personal needs, you may have the following tests: Hemoglobin A1C (HbA1C). This test provides information about blood sugar (glucose) control over the previous 2-3 months. It is used to adjust your treatment plan, if needed. This test will be done: At least 2 times a year, if you are meeting your treatment goals. 4 times a year, if you are not meeting your treatment goals or if your goals have changed. Lipid testing, including total cholesterol, LDL and HDL cholesterol, and triglyceride levels. The goal for LDL is less than 100 mg/dL (5.5 mmol/L). If you are at high risk for complications, the goal is less than 70 mg/dL (3.9 mmol/L). The goal for HDL is 40 mg/dL (2.2 mmol/L) or higher for men, and 50 mg/dL (2.8 mmol/L) or higher for women. An HDL cholesterol of 60 mg/dL (3.3 mmol/L) or higher gives some protection against heart disease. The goal for triglycerides is less than 150 mg/dL (8.3 mmol/L). Liver function tests. Kidney function tests. Thyroid function tests.  Dental and eye exams  Visit your dentist two times a year. If you have type 1 diabetes, your health care provider may recommend an eye exam within 5 years after you are diagnosed, and then once a year after your first exam. For children with type 1 diabetes, the health care provider may recommend an eye exam when your child is age 67 or older and has had diabetes for 3-5 years. After the first exam, your child should get an eye exam once a year. If you have type 2 diabetes, your health care provider may recommend an eye exam as soon as you are diagnosed, and then every 1-2 years after your first exam. Immunizations A yearly flu (influenza) vaccine is recommended annually for everyone 6 months or older. This  is especially important if you have diabetes. The pneumonia (pneumococcal) vaccine is recommended for everyone 2 years or older who has diabetes. If you are age 90 or older, you may get the pneumonia vaccine as a  series of two separate shots. The hepatitis B vaccine is recommended for adults shortly after being diagnosed with diabetes. Adults and children with diabetes should receive all other vaccines according to age-specific recommendations from the Centers for Disease Control and Prevention (CDC). Mental and emotional health Screening for symptoms of eating disorders, anxiety, and depression is recommended at the time of diagnosis and after as needed. If your screening shows that you have symptoms, you may need more evaluation. You may work with a mental health care provider. Follow these instructions at home: Treatment plan You will monitor your blood glucose levels and may give yourself insulin. Your treatment plan will be reviewed at every medical visit. You and your health care provider will discuss: How you are taking your medicines, including insulin. Any side effects you have. Your blood glucose level target goals. How often you monitor your blood glucose level. Lifestyle habits, such as activity level and tobacco, alcohol, and substance use. Education Your health care provider will assess how well you are monitoring your blood glucose levels and whether you are taking your insulin and medicines correctly. He or she may refer you to: A certified diabetes care and education specialist to manage your diabetes throughout your life, starting at diagnosis. A registered dietitian who can create and review your personal nutrition plan. An exercise specialist who can discuss your activity level and exercise plan. General instructions Take over-the-counter and prescription medicines only as told by your health care provider. Keep all follow-up visits. This is important. Where to find support There are many diabetes support networks, including: American Diabetes Association (ADA): diabetes.org Defeat Diabetes Foundation: defeatdiabetes.org Where to find more information American Diabetes  Association (ADA): www.diabetes.org Association of Diabetes Care & Education Specialists (ADCES): diabeteseducator.org International Diabetes Federation (IDF): http://hill.biz/ Summary Managing diabetes (diabetes mellitus) can be complicated. Your diabetes treatment may be managed by a team of health care providers. Your health care providers follow guidelines to help you get the best quality care. You should have physical exams, blood tests, blood pressure monitoring, immunizations, and screening tests regularly. Stay updated on how to manage your diabetes. Your health care providers may also give you more specific instructions based on your individual health. This information is not intended to replace advice given to you by your health care provider. Make sure you discuss any questions you have with your health care provider. Document Revised: 03/01/2020 Document Reviewed: 03/01/2020 Elsevier Patient Education  2023 [employer].

## 2022-07-21 NOTE — Assessment & Plan Note (Signed)
New onset Explained that her A1C is up only small amount compared to Feb 2022, we will recheck and see what it is now, she can do diet/lifestyle efforts and/or diabetes education/nutrition consult Reviewed standard of care of DM Spoke with pt about new diagnosis.  Discussed A1C results with them and explained what an A1C is, basic pathophysiology of DM Type 2, basic home care, basic diabetes diet nutrition principles, importance of checking CBGs and maintaining good CBG control to prevent long-term and short-term complications.  Reviewed signs and symptoms of hyperglycemia and hypoglycemia and how to treat hypoglycemia at home.  Also reviewed blood sugar goals and A1c goals for home.   Pt declined glucometer today She wants to recheck A1C and then take it from there Handout and references included in AVS today Recent pertinent labs: Lab Results  Component Value Date   HGBA1C 6.5 (H) 01/23/2022   HGBA1C 6.4 (H) 10/12/2020   HGBA1C 6.2 (H) 08/04/2018   Lab Results  Component Value Date   LDLCALC 39 01/23/2022   CREATININE 0.87 01/23/2022   Standard of care and health maintenance: Urine Microalbumin:  ordered and done today Foot exam:  done today DM eye exam:  due - she will see if her eye doctor can do it,  if not we can do referral to [facility], explained monitoring for diabetic retinopathy ACEI/ARB:  not taking, BP at goal, reviewed how ACEI/ARB are for renal protection Statin:  not taking, discussed benefit to lower risk of stroke

## 2022-07-22 LAB — COMPLETE METABOLIC PANEL WITH GFR
AG Ratio: 1.3 (calc) (ref 1.0–2.5)
ALT: 9 U/L (ref 6–29)
AST: 11 U/L (ref 10–35)
Albumin: 3.9 g/dL (ref 3.6–5.1)
Alkaline phosphatase (APISO): 72 U/L (ref 37–153)
BUN: 9 mg/dL (ref 7–25)
CO2: 28 mmol/L (ref 20–32)
Calcium: 9 mg/dL (ref 8.6–10.4)
Chloride: 103 mmol/L (ref 98–110)
Creat: 0.82 mg/dL (ref 0.50–1.03)
Globulin: 3 g/dL (calc) (ref 1.9–3.7)
Glucose, Bld: 99 mg/dL (ref 65–99)
Potassium: 4.5 mmol/L (ref 3.5–5.3)
Sodium: 138 mmol/L (ref 135–146)
Total Bilirubin: 0.6 mg/dL (ref 0.2–1.2)
Total Protein: 6.9 g/dL (ref 6.1–8.1)
eGFR: 87 mL/min/{1.73_m2} (ref >=60)

## 2022-07-22 LAB — HEMOGLOBIN A1C
Hgb A1c MFr Bld: 6.6 % of total Hgb — ABNORMAL HIGH (ref <5.7)
Mean Plasma Glucose: 143 mg/dL
eAG (mmol/L): 7.9 mmol/L

## 2022-07-22 LAB — MICROALBUMIN / CREATININE URINE RATIO
Creatinine, Urine: 141 mg/dL (ref 20–275)
Microalb, Ur: <0.2 mg/dL

## 2022-10-13 DIAGNOSIS — H524 Presbyopia: Secondary | ICD-10-CM | POA: Diagnosis not present

## 2023-01-19 ENCOUNTER — Ambulatory Visit (INDEPENDENT_AMBULATORY_CARE_PROVIDER_SITE_OTHER): Payer: 59 | Admitting: Family Medicine

## 2023-01-19 ENCOUNTER — Encounter: Payer: Self-pay | Admitting: Family Medicine

## 2023-01-19 VITALS — BP 122/78 | HR 96 | Temp 98.0°F | Resp 16 | Ht 64.0 in | Wt 209.1 lb

## 2023-01-19 DIAGNOSIS — E669 Obesity, unspecified: Secondary | ICD-10-CM

## 2023-01-19 DIAGNOSIS — Z1231 Encounter for screening mammogram for malignant neoplasm of breast: Secondary | ICD-10-CM

## 2023-01-19 DIAGNOSIS — Z6835 Body mass index (BMI) 35.0-35.9, adult: Secondary | ICD-10-CM

## 2023-01-19 DIAGNOSIS — E119 Type 2 diabetes mellitus without complications: Secondary | ICD-10-CM

## 2023-01-19 DIAGNOSIS — N946 Dysmenorrhea, unspecified: Secondary | ICD-10-CM

## 2023-01-19 DIAGNOSIS — M25512 Pain in left shoulder: Secondary | ICD-10-CM

## 2023-01-19 DIAGNOSIS — D509 Iron deficiency anemia, unspecified: Secondary | ICD-10-CM

## 2023-01-19 LAB — CBC WITH DIFFERENTIAL/PLATELET
Basophils Absolute: 49 cells/uL (ref 0–200)
Eosinophils Absolute: 191 cells/uL (ref 15–500)
Eosinophils Relative: 3.9 %
Hemoglobin: 11.3 g/dL — ABNORMAL LOW (ref 11.7–15.5)
MCV: 79.8 fL — ABNORMAL LOW (ref 80.0–100.0)
MPV: 9.9 fL (ref 7.5–12.5)
Platelets: 406 10*3/uL — ABNORMAL HIGH (ref 140–400)
RBC: 4.41 10*6/uL (ref 3.80–5.10)
WBC: 4.9 10*3/uL (ref 3.8–10.8)

## 2023-01-19 NOTE — Progress Notes (Unsigned)
Name: Rose Mcmillan   MRN: [id number]    DOB: Jan 14, 1971   Date:01/19/2023       Progress Note  Chief Complaint  Patient presents with   Follow-up   Diabetes     Subjective:   Rose Mcmillan is a 52 y.o. female, presents to clinic for routine f/up on chronic conditions  DM:   New onset DM T2 year ago, managed with diet/lifestyle efforts not on meds Denies: Polyuria, polydipsia, vision changes, neuropathy, hypoglycemia Recent pertinent labs: Lab Results  Component Value Date   HGBA1C 6.6 (H) 07/21/2022   HGBA1C 6.5 (H) 01/23/2022   HGBA1C 6.4 (H) 10/12/2020   Lab Results  Component Value Date   MICROALBUR <0.2 07/21/2022   LDLCALC 39 01/23/2022   CREATININE 0.82 07/21/2022   Standard of care and health maintenance: Urine Microalbumin:  UTD Foot exam:  done - due Nov DM eye exam:  due ACEI/ARB:  not Statin:  no  Anemia Hemoglobin  Date Value Ref Range Status  01/23/2022 11.3 (L) 11.7 - 15.5 g/dL Final  69/62/9528 41.3 (L) 11.7 - 15.5 g/dL Final  24/40/1027 25.3 11.7 - 15.5 g/dL Final  66/44/0347 42.5 (L) 12.0 - 15.0 g/dL Final  95/63/8756 43.3 11.1 - 15.9 g/dL Final   Lab Results  Component Value Date   IRON 45 10/12/2020   TIBC 361 10/12/2020   FERRITIN 16 10/12/2020   She did r/o GI blood loss with GI consult Regular menses 30 d, 3-5 d, heavy in the beginning      Current Outpatient Medications:    cetirizine (ZYRTEC) 10 MG tablet, Take by mouth., Disp: , Rfl:   Patient Active Problem List   Diagnosis Date Noted   New onset of type 2 diabetes mellitus in pediatric patient (HCC) 07/21/2022   Type 2 diabetes mellitus without complications (HCC) 01/24/2022   Microcytic anemia 01/24/2022   Dysmenorrhea 06/30/2018   Class 2 obesity with body mass index (BMI) of 35.0 to 35.9 in adult 05/19/2016    Past Surgical History:  Procedure Laterality Date   CESAREAN SECTION     x2   CHOLECYSTECTOMY      Family History  Problem Relation Age  of Onset   Colon cancer Maternal Grandmother 27   Breast cancer Other        70s   Breast cancer Cousin 50   Ovarian cancer Neg Hx     Social History   Tobacco Use   Smoking status: Never   Smokeless tobacco: Never  Vaping Use   Vaping Use: Never used  Substance Use Topics   Alcohol use: No   Drug use: No     Allergies  Allergen Reactions   Penicillins Nausea And Vomiting    Health Maintenance  Topic Date Due   OPHTHALMOLOGY EXAM  Never done   MAMMOGRAM  12/14/2022   HEMOGLOBIN A1C  01/19/2023   COVID-19 Vaccine (3 - 2023-24 season) 02/04/2023 (Originally 05/09/2022)   PAP SMEAR-Modifier  07/01/2023   Diabetic kidney evaluation - eGFR measurement  07/22/2023   Diabetic kidney evaluation - Urine ACR  07/22/2023   FOOT EXAM  07/22/2023   COLONOSCOPY (Pts 45-11yrs Insurance coverage will need to be confirmed)  04/29/2026   DTaP/Tdap/Td (2 - Td or Tdap) 05/19/2026   Hepatitis C Screening  Completed   HIV Screening  Completed   HPV VACCINES  Aged Out   INFLUENZA VACCINE  Discontinued   Zoster Vaccines- Shingrix  Discontinued    Chart  Review Today: ***  Review of Systems   Objective:   Vitals:   01/19/23 1113  BP: 122/78  Pulse: 96  Resp: 16  Temp: 98 F (36.7 C)  TempSrc: Oral  SpO2: 97%  Weight: 209 lb 1.6 oz (94.8 kg)  Height: 5\' 4"  (1.626 m)    Body mass index is 35.89 kg/m.  Physical Exam      Assessment & Plan:   Problem List Items Addressed This Visit       Endocrine   New onset of type 2 diabetes mellitus in pediatric patient [facility]) - Primary   Other Visit Diagnoses     Encounter for screening mammogram for malignant neoplasm of breast            No follow-ups on file.   Danelle Berry, PA-C 01/19/23 11:25 AM

## 2023-01-20 ENCOUNTER — Encounter: Payer: Self-pay | Admitting: Family Medicine

## 2023-01-20 LAB — HEMOGLOBIN A1C
Hgb A1c MFr Bld: 6.5 % of total Hgb — ABNORMAL HIGH (ref <5.7)
Mean Plasma Glucose: 140 mg/dL
eAG (mmol/L): 7.7 mmol/L

## 2023-01-20 LAB — COMPLETE METABOLIC PANEL WITH GFR
AG Ratio: 1.2 (calc) (ref 1.0–2.5)
ALT: 11 U/L (ref 6–29)
AST: 12 U/L (ref 10–35)
Albumin: 3.8 g/dL (ref 3.6–5.1)
Alkaline phosphatase (APISO): 69 U/L (ref 37–153)
BUN: 10 mg/dL (ref 7–25)
CO2: 28 mmol/L (ref 20–32)
Calcium: 8.7 mg/dL (ref 8.6–10.4)
Chloride: 104 mmol/L (ref 98–110)
Creat: 0.79 mg/dL (ref 0.50–1.03)
Globulin: 3.1 g/dL (calc) (ref 1.9–3.7)
Glucose, Bld: 107 mg/dL — ABNORMAL HIGH (ref 65–99)
Potassium: 4.1 mmol/L (ref 3.5–5.3)
Sodium: 139 mmol/L (ref 135–146)
Total Bilirubin: 0.4 mg/dL (ref 0.2–1.2)
Total Protein: 6.9 g/dL (ref 6.1–8.1)
eGFR: 90 mL/min/{1.73_m2} (ref >=60)

## 2023-01-20 LAB — CBC WITH DIFFERENTIAL/PLATELET
Absolute Monocytes: 314 cells/uL (ref 200–950)
Basophils Relative: 1 %
HCT: 35.2 % (ref 35.0–45.0)
Lymphs Abs: 2416 cells/uL (ref 850–3900)
MCH: 25.6 pg — ABNORMAL LOW (ref 27.0–33.0)
MCHC: 32.1 g/dL (ref 32.0–36.0)
Monocytes Relative: 6.4 %
Neutro Abs: 1931 cells/uL (ref 1500–7800)
Neutrophils Relative %: 39.4 %
RDW: 13.3 % (ref 11.0–15.0)
Total Lymphocyte: 49.3 %

## 2023-01-20 NOTE — Assessment & Plan Note (Signed)
Recheck CBC Colonoscopy done in 2022, most likely from chronic blood loss due to dysmenorrhea/menorrhagia with regular cycles Discussed options to slow or reduce bleeding

## 2023-01-20 NOTE — Assessment & Plan Note (Signed)
Weight and BMI stable and unchanged compared to last office visit She has cut out some sources of sugar Discussed consult with diabetes and nutritional education/consult Encouraged to work on healthy nutrition, cutting out empty calories, increasing protein and fiber in diet, observing calorie intake and ensuring she is in a calorie deficit if she wishes to lose weight

## 2023-01-20 NOTE — Assessment & Plan Note (Signed)
Onset about 1 year ago - not on any medications, managing with diet/lifestyle Lab Results  Component Value Date   HGBA1C 6.6 (H) 07/21/2022   HGBA1C 6.5 (H) 01/23/2022   HGBA1C 6.4 (H) 10/12/2020  Foot exam UTD, she did a eye exam in Feb - we will request records Due to repeat A1C today, renal function and urine microalbumin creatinine ratio is done/utd She is not currently on an ACE inhibitor or ARB for renal protection or statin for stroke/MI/ASCVD risk protection At this time she does not want to start this medications -the standard of care and the purpose of those medications was reviewed today with the patient She would like to recheck her sugars and continue to work on diet and lifestyle efforts

## 2023-01-20 NOTE — Assessment & Plan Note (Signed)
Discussed options for management including continuous monophasic OCPs to decrease menstrual bleeding and to help her mild anemia Cycles are currently regular coming about the same time a month and lasting 5 to 6 days with heavy bleeding for only a few days

## 2023-02-12 DIAGNOSIS — M7502 Adhesive capsulitis of left shoulder: Secondary | ICD-10-CM | POA: Diagnosis not present

## 2023-02-25 ENCOUNTER — Ambulatory Visit
Admission: RE | Admit: 2023-02-25 | Discharge: 2023-02-25 | Disposition: A | Discharge status: Home / Self Care | Payer: 59 | Source: Ambulatory Visit | Attending: Family Medicine | Admitting: Family Medicine

## 2023-02-25 DIAGNOSIS — Z1231 Encounter for screening mammogram for malignant neoplasm of breast: Secondary | ICD-10-CM | POA: Insufficient documentation

## 2023-03-03 DIAGNOSIS — M25512 Pain in left shoulder: Secondary | ICD-10-CM | POA: Diagnosis not present

## 2023-03-03 DIAGNOSIS — M25612 Stiffness of left shoulder, not elsewhere classified: Secondary | ICD-10-CM | POA: Diagnosis not present

## 2023-03-16 DIAGNOSIS — M7502 Adhesive capsulitis of left shoulder: Secondary | ICD-10-CM | POA: Diagnosis not present

## 2023-03-25 DIAGNOSIS — M25512 Pain in left shoulder: Secondary | ICD-10-CM | POA: Diagnosis not present

## 2023-03-25 DIAGNOSIS — M25612 Stiffness of left shoulder, not elsewhere classified: Secondary | ICD-10-CM | POA: Diagnosis not present

## 2023-04-01 DIAGNOSIS — M25612 Stiffness of left shoulder, not elsewhere classified: Secondary | ICD-10-CM | POA: Diagnosis not present

## 2023-04-01 DIAGNOSIS — M25512 Pain in left shoulder: Secondary | ICD-10-CM | POA: Diagnosis not present

## 2023-05-04 DIAGNOSIS — M25512 Pain in left shoulder: Secondary | ICD-10-CM | POA: Diagnosis not present

## 2023-05-04 DIAGNOSIS — M25612 Stiffness of left shoulder, not elsewhere classified: Secondary | ICD-10-CM | POA: Diagnosis not present

## 2023-07-20 NOTE — Progress Notes (Signed)
Name: Rose Mcmillan   MRN: [id number]    DOB: [birth year]   Date:07/24/2023       Progress Note  Subjective  Chief Complaint  Chief Complaint  Patient presents with   Annual Exam    HPI  Patient presents for annual CPE.  Diet: Regular, tries to eat well balanced Exercise: None , recommend 150 min of physical activity weekly   Last Eye Exam: 10/2022 Last Dental Exam: 12/2022  Flowsheet Row Office Visit from 07/24/2023 in [facility]  AUDIT-C Score 0      Depression: Phq 9 is  negative    07/24/2023   10:53 AM 01/19/2023   11:04 AM 07/21/2022   10:23 AM 01/23/2022   10:38 AM 10/15/2020    2:28 PM  Depression screen PHQ 2/9  Decreased Interest 0 0 0 0 0  Down, Depressed, Hopeless 0 0 0 0 0  PHQ - 2 Score 0 0 0 0 0  Altered sleeping  0 0 0 0  Tired, decreased energy  0 0 3 0  Change in appetite  0 0 0 0  Feeling bad or failure about yourself   0 0 0 0  Trouble concentrating  0 0 0 0  Moving slowly or fidgety/restless  0 0 0 0  Suicidal thoughts  0 0 0 0  PHQ-9 Score  0 0 3 0  Difficult doing work/chores  Not difficult at all Not difficult at all  Not difficult at all   Hypertension: BP Readings from Last 3 Encounters:  07/24/23 118/82  01/19/23 122/78  07/21/22 124/72   Obesity: Wt Readings from Last 3 Encounters:  07/24/23 211 lb 3.2 oz (95.8 kg)  01/19/23 209 lb 1.6 oz (94.8 kg)  07/21/22 209 lb 3.2 oz (94.9 kg)   BMI Readings from Last 3 Encounters:  07/24/23 36.25 kg/m  01/19/23 35.89 kg/m  07/21/22 35.91 kg/m     Vaccines:  HPV: up to at age 2 , ask insurance if age between 18-45  Shingrix: 24-64 yo and ask insurance if covered when patient above 51 yo Pneumonia:  educated and discussed with patient. Flu:  educated and discussed with patient.     Hep C Screening: completed STD testing and prevention (HIV/chl/gon/syphilis): completed Intimate partner violence: negative screen  Sexual History : yes Menstrual  History/LMP/Abnormal Bleeding: [facility]: 07/14/2023 Discussed importance of follow up if any post-menopausal bleeding:Yes Incontinence Symptoms: negative for symptoms   Breast cancer:  - Last Mammogram: 02/27/2023 - BRCA gene screening: none  Osteoporosis Prevention : Discussed high calcium and vitamin D supplementation, weight bearing exercises Bone density :not applicable   Cervical cancer screening: up to date  Skin cancer: Discussed monitoring for atypical lesions  Colorectal cancer: 04/29/2021   Lung cancer:  Low Dose CT Chest recommended if Age 58-80 years, 20 pack-year currently smoking OR have quit w/in 15years. Patient does not qualify for screen   ECG: 04/09/2020  Advanced Care Planning: A voluntary discussion about advance care planning including the explanation and discussion of advance directives.  Discussed health care proxy and Living will, and the patient was able to identify a health care proxy as husband.  Patient does not have a living will and power of attorney of health care   Lipids: Lab Results  Component Value Date   CHOL 120 01/23/2022   CHOL 143 08/04/2018   Lab Results  Component Value Date   HDL 67 01/23/2022   HDL 67 08/04/2018   Lab  Results  Component Value Date   LDLCALC 39 01/23/2022   LDLCALC 60 08/04/2018   Lab Results  Component Value Date   TRIG 68 01/23/2022   TRIG 80 08/04/2018   Lab Results  Component Value Date   CHOLHDL 1.8 01/23/2022   CHOLHDL 2.1 08/04/2018   No results found for: "LDLDIRECT"  Glucose: Glucose, Bld  Date Value Ref Range Status  01/19/2023 107 (H) 65 - 99 mg/dL Final    Comment:    .            Fasting reference interval . For someone without known diabetes, a glucose value between 100 and 125 mg/dL is consistent with prediabetes and should be confirmed with a follow-up test. .   07/21/2022 99 65 - 99 mg/dL Final    Comment:    .            Fasting reference interval .   01/23/2022 99 65 - 99 mg/dL  Final    Comment:    .            Fasting reference interval .     Patient Active Problem List   Diagnosis Date Noted   Type 2 diabetes mellitus without complications (HCC) 01/24/2022   Microcytic anemia 01/24/2022   Dysmenorrhea 06/30/2018   Class 2 obesity with body mass index (BMI) of 35.0 to 35.9 in adult 05/19/2016    Past Surgical History:  Procedure Laterality Date   CESAREAN SECTION     x2   CHOLECYSTECTOMY      Family History  Problem Relation Age of Onset   Colon cancer Maternal Grandmother 56   Breast cancer Other        70s   Breast cancer Cousin 50   Ovarian cancer Neg Hx     Social History   Socioeconomic History   Marital status: Married    Spouse name: robin   Number of children: 2   Years of education: Not on file   Highest education level: Not on file  Occupational History   Not on file  Tobacco Use   Smoking status: Never    Passive exposure: Never   Smokeless tobacco: Never  Vaping Use   Vaping status: Never Used  Substance and Sexual Activity   Alcohol use: No   Drug use: No   Sexual activity: Yes    Birth control/protection: Surgical    Comment: tubal ligation  Other Topics Concern   Not on file  Social History Narrative   Not on file   Social Determinants of Health   Financial Resource Strain: Low Risk  (07/24/2023)   Overall Financial Resource Strain (CARDIA)    Difficulty of Paying Living Expenses: Not hard at all  Food Insecurity: No Food Insecurity (07/24/2023)   Hunger Vital Sign    Worried About Running Out of Food in the Last Year: Never true    Ran Out of Food in the Last Year: Never true  Transportation Needs: No Transportation Needs (07/24/2023)   PRAPARE - Administrator, Civil Service (Medical): No    Lack of Transportation (Non-Medical): No  Physical Activity: Inactive (07/24/2023)   Exercise Vital Sign    Days of Exercise per Week: 0 days    Minutes of Exercise per Session: 0 min  Stress: No  Stress Concern Present (07/24/2023)   [employer] of Occupational Health - Occupational Stress Questionnaire    Feeling of Stress : Only a little  Social Connections:  Moderately Integrated (07/24/2023)   Social Connection and Isolation Panel [NHANES]    Frequency of Communication with Friends and Family: More than three times a week    Frequency of Social Gatherings with Friends and Family: More than three times a week    Attends Religious Services: More than 4 times per year    Active Member of [employer] or Organizations: No    Attends Banker Meetings: Never    Marital Status: Married  Catering manager Violence: Not At Risk (07/24/2023)   Humiliation, Afraid, Rape, and Kick questionnaire    Fear of Current or Ex-Partner: No    Emotionally Abused: No    Physically Abused: No    Sexually Abused: No     Current Outpatient Medications:    cetirizine (ZYRTEC) 10 MG tablet, Take by mouth., Disp: , Rfl:   Allergies  Allergen Reactions   Penicillins Nausea And Vomiting     ROS  Constitutional: Negative for fever or weight change.  Respiratory: Negative for cough and shortness of breath.   Cardiovascular: Negative for chest pain or palpitations.  Gastrointestinal: Negative for abdominal pain, no bowel changes.  Musculoskeletal: Negative for gait problem or joint swelling.  Skin: Negative for rash.  Neurological: Negative for dizziness or headache.  No other specific complaints in a complete review of systems (except as listed in HPI above).   Objective  Vitals:   07/24/23 1053  BP: 118/82  Pulse: 100  Resp: 16  Temp: 97.9 F (36.6 C)  TempSrc: Oral  SpO2: 98%  Weight: 211 lb 3.2 oz (95.8 kg)    Body mass index is 36.25 kg/m.  Physical Exam Constitutional: Patient appears well-developed and well-nourished. No distress.  HENT: Head: Normocephalic and atraumatic. Ears: B TMs ok, no erythema or effusion; Nose: Nose normal. Mouth/Throat: Oropharynx is  clear and moist. No oropharyngeal exudate.  Eyes: Conjunctivae and EOM are normal. Pupils are equal, round, and reactive to light. No scleral icterus.  Neck: Normal range of motion. Neck supple. No JVD present. No thyromegaly present.  Cardiovascular: Normal rate, regular rhythm and normal heart sounds.  No murmur heard. No BLE edema. Pulmonary/Chest: Effort normal and breath sounds normal. No respiratory distress. Abdominal: Soft. Bowel sounds are normal, no distension. There is no tenderness. no masses Musculoskeletal: Normal range of motion, no joint effusions. No gross deformities Neurological: he is alert and oriented to person, place, and time. No cranial nerve deficit. Coordination, balance, strength, speech and gait are normal.  Skin: Skin is warm and dry. No rash noted. No erythema.  Psychiatric: Patient has a normal mood and affect. behavior is normal. Judgment and thought content normal.  Diabetic Foot Exam - Simple   Simple Foot Form Diabetic Foot exam was performed with the following findings: Yes 07/24/2023 11:01 AM  Visual Inspection No deformities, no ulcerations, no other skin breakdown bilaterally: Yes Sensation Testing Intact to touch and monofilament testing bilaterally: Yes Pulse Check Posterior Tibialis and Dorsalis pulse intact bilaterally: Yes Comments      No results found for this or any previous visit (from the past 2160 hour(s)).   Fall Risk:    07/24/2023   10:53 AM 01/19/2023   11:04 AM 07/21/2022   10:23 AM 01/23/2022   10:38 AM 10/15/2020    2:28 PM  Fall Risk   Falls in the past year? 0 0 0 0 0  Number falls in past yr: 0 0 0 0 0  Injury with Fall? 0 0 0 0  0  Risk for fall due to : No Fall Risks No Fall Risks No Fall Risks No Fall Risks   Follow up Falls prevention discussed Falls prevention discussed;Education provided;Falls evaluation completed Falls prevention discussed;Education provided;Falls evaluation completed Falls prevention discussed       Functional Status Survey: Is the patient deaf or have difficulty hearing?: No Does the patient have difficulty seeing, even when wearing glasses/contacts?: No Does the patient have difficulty concentrating, remembering, or making decisions?: No Does the patient have difficulty walking or climbing stairs?: No Does the patient have difficulty dressing or bathing?: No Does the patient have difficulty doing errands alone such as visiting a doctor's office or shopping?: No   Assessment & Plan  1. Annual physical exam  - CBC with Differential/Platelet - COMPLETE METABOLIC PANEL WITH GFR - Lipid panel - Hemoglobin A1c - HM Diabetes Foot Exam - Microalbumin / creatinine urine ratio  2. Type 2 diabetes mellitus without complication, without long-term current use of insulin (HCC)  - COMPLETE METABOLIC PANEL WITH GFR - Hemoglobin A1c - HM Diabetes Foot Exam - Microalbumin / creatinine urine ratio  3. Microcytic anemia  - CBC with Differential/Platelet  4. Screening for cholesterol level  - Lipid panel   -USPSTF grade A and B recommendations reviewed with patient; age-appropriate recommendations, preventive care, screening tests, etc discussed and encouraged; healthy living encouraged; see AVS for patient education given to patient -Discussed importance of 150 minutes of physical activity weekly, eat two servings of fish weekly, eat one serving of tree nuts ( cashews, pistachios, pecans, almonds.Marland Kitchen) every other day, eat 6 servings of fruit/vegetables daily and drink plenty of water and avoid sweet beverages.   -Reviewed Health Maintenance: Yes.

## 2023-07-24 ENCOUNTER — Encounter: Payer: 59 | Admitting: Family Medicine

## 2023-07-24 ENCOUNTER — Encounter: Payer: Self-pay | Admitting: Nurse Practitioner

## 2023-07-24 ENCOUNTER — Other Ambulatory Visit: Payer: Self-pay

## 2023-07-24 ENCOUNTER — Ambulatory Visit (INDEPENDENT_AMBULATORY_CARE_PROVIDER_SITE_OTHER): Payer: 59 | Admitting: Nurse Practitioner

## 2023-07-24 VITALS — BP 118/82 | HR 100 | Temp 97.9°F | Resp 16 | Wt 211.2 lb

## 2023-07-24 DIAGNOSIS — Z13220 Encounter for screening for lipoid disorders: Secondary | ICD-10-CM

## 2023-07-24 DIAGNOSIS — D509 Iron deficiency anemia, unspecified: Secondary | ICD-10-CM | POA: Diagnosis not present

## 2023-07-24 DIAGNOSIS — Z0001 Encounter for general adult medical examination with abnormal findings: Secondary | ICD-10-CM

## 2023-07-24 DIAGNOSIS — Z0000 Encounter for general adult medical examination without abnormal findings: Secondary | ICD-10-CM

## 2023-07-24 DIAGNOSIS — E119 Type 2 diabetes mellitus without complications: Secondary | ICD-10-CM

## 2023-07-25 LAB — CBC WITH DIFFERENTIAL/PLATELET
Absolute Lymphocytes: 2506 {cells}/uL (ref 850–3900)
Absolute Monocytes: 516 {cells}/uL (ref 200–950)
Basophils Absolute: 58 {cells}/uL (ref 0–200)
Basophils Relative: 1 %
Eosinophils Absolute: 151 {cells}/uL (ref 15–500)
Eosinophils Relative: 2.6 %
HCT: 34.2 % — ABNORMAL LOW (ref 35.0–45.0)
Hemoglobin: 10.8 g/dL — ABNORMAL LOW (ref 11.7–15.5)
MCH: 25.3 pg — ABNORMAL LOW (ref 27.0–33.0)
MCHC: 31.6 g/dL — ABNORMAL LOW (ref 32.0–36.0)
MCV: 80.1 fL (ref 80.0–100.0)
MPV: 10.4 fL (ref 7.5–12.5)
Monocytes Relative: 8.9 %
Neutro Abs: 2569 {cells}/uL (ref 1500–7800)
Neutrophils Relative %: 44.3 %
Platelets: 387 10*3/uL (ref 140–400)
RBC: 4.27 10*6/uL (ref 3.80–5.10)
RDW: 13.6 % (ref 11.0–15.0)
Total Lymphocyte: 43.2 %
WBC: 5.8 10*3/uL (ref 3.8–10.8)

## 2023-07-25 LAB — LIPID PANEL
Cholesterol: 118 mg/dL (ref <200)
HDL: 58 mg/dL (ref >=50)
LDL Cholesterol (Calc): 41 mg/dL
Non-HDL Cholesterol (Calc): 60 mg/dL (ref <130)
Total CHOL/HDL Ratio: 2 (calc) (ref <5.0)
Triglycerides: 107 mg/dL (ref <150)

## 2023-07-25 LAB — COMPLETE METABOLIC PANEL WITH GFR
AG Ratio: 1.4 (calc) (ref 1.0–2.5)
ALT: 10 U/L (ref 6–29)
AST: 13 U/L (ref 10–35)
Albumin: 3.9 g/dL (ref 3.6–5.1)
Alkaline phosphatase (APISO): 65 U/L (ref 37–153)
BUN: 8 mg/dL (ref 7–25)
CO2: 28 mmol/L (ref 20–32)
Calcium: 8.8 mg/dL (ref 8.6–10.4)
Chloride: 104 mmol/L (ref 98–110)
Creat: 0.79 mg/dL (ref 0.50–1.03)
Globulin: 2.8 g/dL (ref 1.9–3.7)
Glucose, Bld: 102 mg/dL — ABNORMAL HIGH (ref 65–99)
Potassium: 4.3 mmol/L (ref 3.5–5.3)
Sodium: 138 mmol/L (ref 135–146)
Total Bilirubin: 0.4 mg/dL (ref 0.2–1.2)
Total Protein: 6.7 g/dL (ref 6.1–8.1)
eGFR: 90 mL/min/{1.73_m2} (ref >=60)

## 2023-07-25 LAB — HEMOGLOBIN A1C
Hgb A1c MFr Bld: 6.7 %{Hb} — ABNORMAL HIGH (ref <5.7)
Mean Plasma Glucose: 146 mg/dL
eAG (mmol/L): 8.1 mmol/L

## 2023-07-25 LAB — MICROALBUMIN / CREATININE URINE RATIO
Creatinine, Urine: 210 mg/dL (ref 20–275)
Microalb Creat Ratio: 2 mg/g{creat} (ref <30)
Microalb, Ur: 0.4 mg/dL

## 2023-08-25 ENCOUNTER — Encounter: Payer: Self-pay | Admitting: Family Medicine

## 2023-08-25 NOTE — Telephone Encounter (Signed)
 Care team updated and letter sent for eye exam notes.

## 2023-10-28 ENCOUNTER — Encounter: Payer: Self-pay | Admitting: Obstetrics and Gynecology

## 2024-01-25 ENCOUNTER — Ambulatory Visit: Payer: 59 | Admitting: Family Medicine

## 2024-03-08 ENCOUNTER — Ambulatory Visit: Admitting: Family Medicine

## 2024-03-08 DIAGNOSIS — D509 Iron deficiency anemia, unspecified: Secondary | ICD-10-CM

## 2024-03-08 DIAGNOSIS — E119 Type 2 diabetes mellitus without complications: Secondary | ICD-10-CM

## 2024-03-08 DIAGNOSIS — Z23 Encounter for immunization: Secondary | ICD-10-CM

## 2024-03-08 DIAGNOSIS — Z1231 Encounter for screening mammogram for malignant neoplasm of breast: Secondary | ICD-10-CM

## 2024-04-12 ENCOUNTER — Encounter: Payer: Self-pay | Admitting: Obstetrics and Gynecology

## 2024-04-12 LAB — HM DIABETES EYE EXAM

## 2024-04-18 ENCOUNTER — Encounter: Payer: Self-pay | Admitting: Family Medicine

## 2024-04-18 ENCOUNTER — Ambulatory Visit: Admitting: Family Medicine

## 2024-04-18 VITALS — BP 126/74 | HR 86 | Resp 16 | Ht 64.0 in | Wt 198.0 lb

## 2024-04-18 DIAGNOSIS — Z6835 Body mass index (BMI) 35.0-35.9, adult: Secondary | ICD-10-CM

## 2024-04-18 DIAGNOSIS — E119 Type 2 diabetes mellitus without complications: Secondary | ICD-10-CM

## 2024-04-18 DIAGNOSIS — Z1231 Encounter for screening mammogram for malignant neoplasm of breast: Secondary | ICD-10-CM

## 2024-04-18 DIAGNOSIS — D509 Iron deficiency anemia, unspecified: Secondary | ICD-10-CM

## 2024-04-18 DIAGNOSIS — E66812 Obesity, class 2: Secondary | ICD-10-CM

## 2024-04-18 DIAGNOSIS — N946 Dysmenorrhea, unspecified: Secondary | ICD-10-CM

## 2024-04-18 DIAGNOSIS — R6889 Other general symptoms and signs: Secondary | ICD-10-CM

## 2024-04-18 NOTE — Assessment & Plan Note (Signed)
 She has lost some weight Associated DM She would like labs and thyroid  checked

## 2024-04-18 NOTE — Progress Notes (Signed)
 Name: Rose Mcmillan   MRN: [id number]    DOB: Jun 05, 1971   Date:04/18/2024       Progress Note  Chief Complaint  Patient presents with   Medical Management of Chronic Issues    6 month follow-up   Diabetes     Subjective:   Rose Mcmillan is a 53 y.o. female, presents to clinic for routine follow up on chronic conditions  DM managed with diet/lifestyle not on any meds Lab Results  Component Value Date   HGBA1C 6.7 (H) 07/24/2023   HGBA1C 6.5 (H) 01/19/2023   HGBA1C 6.6 (H) 07/21/2022   HGBA1C 6.5 (H) 01/23/2022   HGBA1C 6.4 (H) 10/12/2020   Lost 13 lbs since last OV Nov - no particular diet/lifestyle changes  Wt Readings from Last 5 Encounters:  04/18/24 198 lb (89.8 kg)  07/24/23 211 lb 3.2 oz (95.8 kg)  01/19/23 209 lb 1.6 oz (94.8 kg)  07/21/22 209 lb 3.2 oz (94.9 kg)  01/23/22 206 lb (93.4 kg)   BMI Readings from Last 5 Encounters:  04/18/24 33.99 kg/m  07/24/23 36.25 kg/m  01/19/23 35.89 kg/m  07/21/22 35.91 kg/m  01/23/22 35.36 kg/m    Chronic anemia with dymenorrhea - menses becoming more irregular - sometimes monthly some times she will skip a month and they are very heavy, some mood changes and hot flashes starting possibly Hemoglobin  Date Value Ref Range Status  07/24/2023 10.8 (L) 11.7 - 15.5 g/dL Final  94/86/7975 88.6 (L) 11.7 - 15.5 g/dL Final  94/81/7976 88.6 (L) 11.7 - 15.5 g/dL Final  97/95/7977 89.2 (L) 11.7 - 15.5 g/dL Final  92/86/7979 87.3 11.1 - 15.9 g/dL Final   Lab Results  Component Value Date   IRON  45 10/12/2020   TIBC 361 10/12/2020   FERRITIN 16 10/12/2020   Lipids done last Nov Lab Results  Component Value Date   CHOL 118 07/24/2023   HDL 58 07/24/2023   LDLCALC 41 07/24/2023   TRIG 107 07/24/2023   CHOLHDL 2.0 07/24/2023    On vit D supplement  Discussed the use of AI scribe software for clinical note transcription with the patient, who gave verbal consent to proceed.  History of Present  Illness Rose Mcmillan is a 53 year old female with type 2 diabetes and mild anemia who presents for follow-up on her A1c and anemia.  Glycemic control - Type 2 diabetes mellitus with A1c increased from 6.4% (three years ago) to 6.7% (most recent check) - No diabetes medications currently prescribed - 13-pound unintentional weight loss since November without changes in diet or exercise  Anemia - Mild anemia with prior evaluation including colonoscopy and iron  panel showing no iron  deficiency - Takes vitamin D, vitamin B12, and CoQ10 supplements, which improve energy levels  Menstrual irregularity and vasomotor symptoms - Menstrual cycles are irregular, occurring every two months - Heavy menstrual bleeding - Mild, infrequent hot flashes  Preventive health maintenance - Recent eye examination was normal - Due for mammogram  Mood and sleep - Mood and sleep are stable     Current Outpatient Medications:    cetirizine (ZYRTEC) 10 MG tablet, Take by mouth., Disp: , Rfl:   Patient Active Problem List   Diagnosis Date Noted   Type 2 diabetes mellitus without complications (HCC) 01/24/2022   Microcytic anemia 01/24/2022   Dysmenorrhea 06/30/2018   Class 2 obesity with body mass index (BMI) of 35.0 to 35.9 in adult 05/19/2016    Past Surgical History:  Procedure Laterality Date   CESAREAN SECTION     x2   CHOLECYSTECTOMY      Family History  Problem Relation Age of Onset   Colon cancer Maternal Grandmother 15   Breast cancer Other        106s   Breast cancer Cousin 50   Ovarian cancer Neg Hx     Social History   Tobacco Use   Smoking status: Never    Passive exposure: Never   Smokeless tobacco: Never  Vaping Use   Vaping status: Never Used  Substance Use Topics   Alcohol use: No   Drug use: No     Allergies  Allergen Reactions   Penicillins Nausea And Vomiting    Health Maintenance  Topic Date Due   Cervical Cancer Screening (HPV/Pap Cotest)   07/01/2023   HEMOGLOBIN A1C  01/21/2024   MAMMOGRAM  02/25/2024   COVID-19 Vaccine (3 - 2024-25 season) 05/04/2024 (Originally 05/10/2023)   Hepatitis B Vaccines (1 of 3 - 19+ 3-dose series) 03/07/2025 (Originally 11/05/1989)   Pneumococcal Vaccine: 19-49 Years (1 of 2 - PCV) 04/18/2025 (Originally 11/05/1989)   Pneumococcal Vaccine: 50+ Years (1 of 2 - PCV) 04/18/2025 (Originally 11/05/1989)   Diabetic kidney evaluation - eGFR measurement  07/23/2024   Diabetic kidney evaluation - Urine ACR  07/23/2024   FOOT EXAM  07/23/2024   OPHTHALMOLOGY EXAM  04/12/2025   Colonoscopy  04/29/2026   DTaP/Tdap/Td (2 - Td or Tdap) 05/19/2026   Hepatitis C Screening  Completed   HIV Screening  Completed   HPV VACCINES  Aged Out   Meningococcal B Vaccine  Aged Out   INFLUENZA VACCINE  Discontinued   Zoster Vaccines- Shingrix  Discontinued    Chart Review Today: I personally reviewed active problem list, medication list, allergies, family history, social history, health maintenance, notes from last encounter, lab results, imaging with the patient/caregiver today.   Review of Systems  Constitutional: Negative.   HENT: Negative.    Eyes: Negative.   Respiratory: Negative.    Cardiovascular: Negative.   Gastrointestinal: Negative.   Endocrine: Negative.   Genitourinary: Negative.   Musculoskeletal: Negative.   Skin: Negative.   Allergic/Immunologic: Negative.   Neurological: Negative.   Hematological: Negative.   Psychiatric/Behavioral: Negative.    All other systems reviewed and are negative.    Objective:   Vitals:   04/18/24 1532  BP: 126/74  Pulse: 86  Resp: 16  SpO2: 97%  Weight: 198 lb (89.8 kg)  Height: 5' 4 (1.626 m)    Body mass index is 33.99 kg/m.  Physical Exam Vitals and nursing note reviewed.  Constitutional:      General: She is not in acute distress.    Appearance: Normal appearance. She is well-developed. She is obese. She is not ill-appearing, toxic-appearing or  diaphoretic.  HENT:     Head: Normocephalic and atraumatic.     Right Ear: External ear normal.     Left Ear: External ear normal.     Nose: Nose normal.  Eyes:     General: No scleral icterus.       Right eye: No discharge.        Left eye: No discharge.     Conjunctiva/sclera: Conjunctivae normal.  Neck:     Trachea: No tracheal deviation.  Cardiovascular:     Rate and Rhythm: Normal rate.  Pulmonary:     Effort: Pulmonary effort is normal. No respiratory distress.     Breath sounds: No stridor.  Skin:    General: Skin is warm and dry.     Findings: No rash.  Neurological:     Mental Status: She is alert.     Motor: No abnormal muscle tone.     Coordination: Coordination normal.     Gait: Gait normal.  Psychiatric:        Mood and Affect: Mood normal.        Behavior: Behavior normal.        Results for orders placed or performed in visit on 04/13/24  HM DIABETES EYE EXAM   Collection Time: 04/12/24 12:14 PM  Result Value Ref Range   HM Diabetic Eye Exam No Retinopathy No Retinopathy      Assessment & Plan:   Type 2 diabetes mellitus without complication, without long-term current use of insulin (HCC) Assessment & Plan: Well controlled with only diet/lifestyle Lab Results  Component Value Date   HGBA1C 6.7 (H) 07/24/2023   HGBA1C 6.5 (H) 01/19/2023   HGBA1C 6.6 (H) 07/21/2022   HGBA1C 6.5 (H) 01/23/2022   HGBA1C 6.4 (H) 10/12/2020  DM eye exam and foot exam UTD Pt not on acei/arb or statin  Orders: -     Hemoglobin A1c -     Comprehensive metabolic panel with GFR -     Microalbumin / creatinine urine ratio  Microcytic anemia Assessment & Plan: Recheck anemia - likely from chronic blood loss Pt does experience fatigue Could consider tx to decrease menorrhagia and supplement iron  to help improve anemia She has previously consulted GI to r/o GI blood loss  Orders: -     CBC with Differential/Platelet  Class 2 obesity with body mass index (BMI) of  35.0 to 35.9 in adult, unspecified obesity type, unspecified whether serious comorbidity present Assessment & Plan: She has lost some weight Associated DM She would like labs and thyroid  checked  Orders: -     Hemoglobin A1c -     Comprehensive metabolic panel with GFR -     CBC with Differential/Platelet -     Thyroid  Panel With TSH  Breast cancer screening by mammogram -     3D Screening Mammogram, Left and Right; Future  Unintentional weight change -     Thyroid  Panel With TSH  Dysmenorrhea Assessment & Plan: Irregular and heavy - discussed tx options if pt wishes to return to discuss more thoroughly IUD via OBGYN an options as is OCP pill or patch depending on her risk   Assessment and Plan Assessment & Plan Type 2 diabetes mellitus without complications Type 2 diabetes is well-controlled with A1c of 6.7. Recent weight loss of 13 pounds is expected to improve A1c. - Recheck A1c in 3 months. - Discuss dietary changes or medication if A1c exceeds 7.0.  Iron  deficiency anemia Mild anemia with no iron  deficiency. Heavy menstrual bleeding may cause chronic blood loss and anemia. - Recheck blood levels today.  Abnormal uterine bleeding and dysmenorrhea Irregular cycles with heavy bleeding may contribute to anemia. Symptoms may relate to perimenopause. - Recheck blood levels today. - Discuss hormonal treatment if symptoms worsen.  Class 2 obesity with BMI 35.0-35.9 Recent weight loss of 13 pounds is expected to improve A1c and health.  Perimenopausal symptoms Mild symptoms include irregular periods and mild hot flashes, not currently disruptive. - Discuss hormonal treatment if symptoms become disruptive, evaluating risks and benefits, especially for women over sixty.  Recording duration: 11 minutes     Return in about 6 months (around 10/19/2024) for  Annual Physical.   Michelene Cower, PA-C 04/18/24 3:48 PM

## 2024-04-18 NOTE — Assessment & Plan Note (Signed)
 Irregular and heavy - discussed tx options if pt wishes to return to discuss more thoroughly IUD via OBGYN an options as is OCP pill or patch depending on her risk

## 2024-04-18 NOTE — Assessment & Plan Note (Signed)
 Well controlled with only diet/lifestyle Lab Results  Component Value Date   HGBA1C 6.7 (H) 07/24/2023   HGBA1C 6.5 (H) 01/19/2023   HGBA1C 6.6 (H) 07/21/2022   HGBA1C 6.5 (H) 01/23/2022   HGBA1C 6.4 (H) 10/12/2020  DM eye exam and foot exam UTD Pt not on acei/arb or statin

## 2024-04-18 NOTE — Assessment & Plan Note (Signed)
 Recheck anemia - likely from chronic blood loss Pt does experience fatigue Could consider tx to decrease menorrhagia and supplement iron  to help improve anemia She has previously consulted GI to r/o GI blood loss

## 2024-04-19 ENCOUNTER — Ambulatory Visit: Payer: Self-pay | Admitting: Family Medicine

## 2024-04-19 LAB — COMPREHENSIVE METABOLIC PANEL WITH GFR
AG Ratio: 1.4 (calc) (ref 1.0–2.5)
ALT: 11 U/L (ref 6–29)
AST: 12 U/L (ref 10–35)
Albumin: 4.3 g/dL (ref 3.6–5.1)
Alkaline phosphatase (APISO): 70 U/L (ref 37–153)
BUN: 8 mg/dL (ref 7–25)
CO2: 27 mmol/L (ref 20–32)
Calcium: 9.3 mg/dL (ref 8.6–10.4)
Chloride: 103 mmol/L (ref 98–110)
Creat: 0.82 mg/dL (ref 0.50–1.03)
Globulin: 3 g/dL (ref 1.9–3.7)
Glucose, Bld: 90 mg/dL (ref 65–99)
Potassium: 3.9 mmol/L (ref 3.5–5.3)
Sodium: 139 mmol/L (ref 135–146)
Total Bilirubin: 0.6 mg/dL (ref 0.2–1.2)
Total Protein: 7.3 g/dL (ref 6.1–8.1)
eGFR: 85 mL/min/1.73m2 (ref >=60)

## 2024-04-19 LAB — CBC WITH DIFFERENTIAL/PLATELET
Absolute Lymphocytes: 2992 {cells}/uL (ref 850–3900)
Absolute Monocytes: 358 {cells}/uL (ref 200–950)
Basophils Absolute: 50 {cells}/uL (ref 0–200)
Basophils Relative: 0.9 %
Eosinophils Absolute: 138 {cells}/uL (ref 15–500)
Eosinophils Relative: 2.5 %
HCT: 37.5 % (ref 35.0–45.0)
Hemoglobin: 11.9 g/dL (ref 11.7–15.5)
MCH: 25.9 pg — ABNORMAL LOW (ref 27.0–33.0)
MCHC: 31.7 g/dL — ABNORMAL LOW (ref 32.0–36.0)
MCV: 81.7 fL (ref 80.0–100.0)
MPV: 10.2 fL (ref 7.5–12.5)
Monocytes Relative: 6.5 %
Neutro Abs: 1964 {cells}/uL (ref 1500–7800)
Neutrophils Relative %: 35.7 %
Platelets: 384 Thousand/uL (ref 140–400)
RBC: 4.59 Million/uL (ref 3.80–5.10)
RDW: 13.6 % (ref 11.0–15.0)
Total Lymphocyte: 54.4 %
WBC: 5.5 Thousand/uL (ref 3.8–10.8)

## 2024-04-19 LAB — THYROID PANEL WITH TSH
Free Thyroxine Index: 2.5 (ref 1.4–3.8)
T3 Uptake: 30 % (ref 22–35)
T4, Total: 8.3 ug/dL (ref 5.1–11.9)
TSH: 0.96 m[IU]/L

## 2024-04-19 LAB — MICROALBUMIN / CREATININE URINE RATIO
Creatinine, Urine: 265 mg/dL (ref 20–275)
Microalb Creat Ratio: 2 mg/g{creat} (ref <30)
Microalb, Ur: 0.6 mg/dL

## 2024-04-19 LAB — HEMOGLOBIN A1C
Hgb A1c MFr Bld: 6.7 % — ABNORMAL HIGH (ref <5.7)
Mean Plasma Glucose: 146 mg/dL
eAG (mmol/L): 8.1 mmol/L

## 2024-10-24 ENCOUNTER — Encounter: Admitting: Family Medicine
